# Patient Record
Sex: Female | Born: 1955 | Race: Black or African American | Hispanic: No | Smoking: Former smoker
Health system: Southern US, Community
[De-identification: ages and names within clinical notes are randomized; demographics above are authoritative.]

## PROBLEM LIST (undated history)

## (undated) DIAGNOSIS — K219 Gastro-esophageal reflux disease without esophagitis: Secondary | ICD-10-CM

## (undated) DIAGNOSIS — R519 Headache, unspecified: Secondary | ICD-10-CM

## (undated) DIAGNOSIS — I10 Essential (primary) hypertension: Secondary | ICD-10-CM

## (undated) DIAGNOSIS — M1711 Unilateral primary osteoarthritis, right knee: Secondary | ICD-10-CM

## (undated) DIAGNOSIS — R7303 Prediabetes: Secondary | ICD-10-CM

## (undated) DIAGNOSIS — Z8709 Personal history of other diseases of the respiratory system: Secondary | ICD-10-CM

## (undated) HISTORY — PX: SHOULDER SURGERY: SHX246

## (undated) HISTORY — PX: OTHER SURGICAL HISTORY: SHX169

---

## 2009-03-09 ENCOUNTER — Ambulatory Visit: Payer: Self-pay | Admitting: Family Medicine

## 2009-03-24 ENCOUNTER — Ambulatory Visit: Payer: Self-pay | Admitting: Family Medicine

## 2010-08-22 ENCOUNTER — Ambulatory Visit: Payer: Self-pay | Admitting: Family Medicine

## 2010-11-21 ENCOUNTER — Emergency Department: Payer: Self-pay | Admitting: Unknown Physician Specialty

## 2014-07-06 ENCOUNTER — Emergency Department: Payer: Self-pay | Admitting: Emergency Medicine

## 2014-09-02 ENCOUNTER — Ambulatory Visit: Payer: Self-pay | Admitting: Unknown Physician Specialty

## 2014-10-11 ENCOUNTER — Ambulatory Visit: Payer: Self-pay | Admitting: Anesthesiology

## 2014-10-11 DIAGNOSIS — I10 Essential (primary) hypertension: Secondary | ICD-10-CM

## 2014-10-14 ENCOUNTER — Ambulatory Visit: Payer: Self-pay | Admitting: Surgery

## 2014-12-19 NOTE — Op Note (Signed)
PATIENT NAME:  Brandi Wiggins, KOPPEL MR#:  [id number] DATE OF BIRTH:  [birth year]  DATE OF PROCEDURE:  10/14/2014  PREOPERATIVE DIAGNOSIS: Rotator cuff tear, left shoulder   POSTOPERATIVE DIAGNOSES:  Rotator cuff tear with a SLAP tear and early degenerative joint disease, left shoulder.   PROCEDURES: Arthroscopic SLAP repair, arthroscopic debridement of grade 3 chondromalacial changes of the glenoid, arthroscopic subacromial decompression, mini-open repair of rotator cuff tear, and mini-open biceps tenodesis left shoulder.   SURGEON:  Maryagnes Amos, M.D.   ASSISTANT: Dedra Skeens,  PA-C.  ANESTHESIA: General endotracheal with interscalene block placed preoperatively by the anesthesiologist.   FINDINGS: As noted above. There was a full-thickness tear involving the mid and posterior insertional fibers of the supraspinatus tendon. The remaining portions of the rotator cuff all were in satisfactory condition. The labrum was torn from the 11 o'clock to the 12:30 position with fraying anteriorly and inferiorly as well. There were grade 3 chondromalacial changes involving the lower half of the glenoid.   COMPLICATIONS: None.   ESTIMATED BLOOD LOSS: Minimal.   TOTAL FLUIDS:  800 mL of crystalloid.   TOURNIQUET: None.   DRAINS: None.  CLOSURE: Staples.   BRIEF CLINICAL NOTE: The patient is a 59 year old female with a 6+ month history of left shoulder pain. Her symptoms have persisted despite medications, activity modification, et Karie Soda. Her history and examination are consistent a rotator cuff tear confirmed by MRI scan. She presents at this time for arthroscopy, debridement, decompression, and repair of the rotator cuff tear.   DESCRIPTION OF PROCEDURE: The patient was brought into the Operating Room and lain in the supine position. After adequate IV sedation was achieved, an interscalene block was placed by the anesthesiologist. The patient then underwent general endotracheal intubation and  anesthesia before being repositioned in the beach chair position using the beach chair positioner. The left shoulder and upper extremity were prepped with ChloraPrep solution before being draped sterilely. Preoperative antibiotics were administered. The expected portal sites and incision site were injected with 1% lidocaine with epinephrine before the camera was placed in the posterior portal. The glenohumeral joint was thoroughly inspected with the findings as described above. An anterior portal was created using an outside-in technique. The labrum and rotator cuff tears were carefully probed, confirming the above noted findings. The shaver was used to debride the frayed margins of the rotator cuff, the labrum, and the areas of chondromalacia on the glenoid. The biceps was released from its labral anchor using the ArthroCare wand. The ArthroCare wand also was used to contour the bicipital insertion site on the labrum as well as to anneal the frayed margins of the labrum. A separate superolateral portal site was created using an outside in technique and the torn portion of the superior labrum was secured using a single BioKnotless anchor placed at approximately the 12 o'clock position. This was done after the exposed glenoid rim was roughened with an end-cutting rasp and full radius resector to stimulate good healing response. Subsequent probing of the repair demonstrated excellent stability. The instruments were removed from the joint after suctioning the excess fluid.   The camera was repositioned through the posterior portal into the subacromial space. A separate lateral portal was created and the shaver introduced to perform a subtotal debridement of the bursa. The ArthroCare wand was introduced and used to recess the coracoacromial ligament from its attachment along the anterior and lateral margins of the acromion as well as to remove the periosteal tissues off the undersurface of  the anterior third of the  acromion. It also was used to obtain hemostasis. The 4 mm acromionizer bur was inserted and used to complete the decompression by removing the undersurface, anterior third of the acromion. The shaver was reintroduced to remove any residual bony debris before the ArthroCare wand was reintroduced to obtain hemostasis. These instruments then were removed from the subacromial space after suctioning the excess fluid.   An approximately 4 to 5 cm incision was made over the anterolateral aspect of the shoulder, incorporating the anterolateral portal site. The incision was carried down through the subcutaneous tissues to expose the deltoid fascia. The raphe between the anterior and middle thirds was identified and this plane developed to provide access into the subacromial space. Additional bursal tissues were debrided to improve visualization. The frayed margins of the rotator cuff were trimmed using a #15 blade. The exposed greater tuberosity was roughened with a rongeur before the tear was reapproximated using two 2.9 mm Biomet JuggerKnot anchors. The tear had a short longitudinal component posteriorly which was repaired in a side-to-side fashion using a #0 Ethibond interrupted suture. Both sets of sutures from the more anterior anchor were placed through the lateral flap of the torn rotator cuff whereas the more anterior suture of the more posterior anchor was placed through the lateral margin of the torn portion of the rotator cuff whereas the posterior sutures were placed through the posterior portion of the torn rotator cuff. The sutures were tied securely. Several of these sutures were then brought out laterally through bone tunnels and tied over bony bridges to effect a two layer closure. An apparent watertight closure was obtained.   The bicipital groove was identified by palpation and opened to deliver the biceps tendon into the wound. The floor of the bicipital groove was roughened with a curette before a  third 2.9 mm Biomet JuggerKnot anchor was inserted. Both sets of sutures were passed through the tendon and tied securely to effect the tenodesis. Two additional #0 Ethibond sutures were placed to close the bicipital sheath, incorporating the biceps tendon to further reinforce the tenodesis.   The wound was copiously irrigated with sterile saline solution before the deltoid raphe was reapproximated using 2-0 Vicryl interrupted sutures. The subcutaneous tissues were closed in two layers using 2-0 Vicryl interrupted sutures before the skin was closed using staples. The portal sites also were closed using staples. A sterile bulky dressing was applied to the shoulder before the arm was placed into a shoulder immobilizer. The patient was then awakened, extubated, and returned to the recovery room in satisfactory condition after tolerating the procedure well.     ____________________________ J. Derald MacleodJeffrey Janyra Barillas, MD jjp:at D: 10/14/2014 10:11:22 ET T: 10/14/2014 18:08:54 ET JOB#: [id number]  cc: Maryagnes AmosJ. Jeffrey Manal Kreutzer, MD, <Dictator> Maryagnes AmosJ. JEFFREY Samyia Motter MD ELECTRONICALLY SIGNED 10/19/2014 11:39

## 2017-03-07 ENCOUNTER — Encounter: Payer: Self-pay | Admitting: *Deleted

## 2017-03-07 ENCOUNTER — Ambulatory Visit
Admission: EM | Admit: 2017-03-07 | Discharge: 2017-03-07 | Disposition: A | Discharge status: Home / Self Care | Payer: BLUE CROSS/BLUE SHIELD | Attending: Family Medicine | Admitting: Family Medicine

## 2017-03-07 DIAGNOSIS — N390 Urinary tract infection, site not specified: Secondary | ICD-10-CM

## 2017-03-07 DIAGNOSIS — B373 Candidiasis of vulva and vagina: Secondary | ICD-10-CM

## 2017-03-07 DIAGNOSIS — B3731 Acute candidiasis of vulva and vagina: Secondary | ICD-10-CM

## 2017-03-07 LAB — URINALYSIS, COMPLETE (UACMP) WITH MICROSCOPIC
Bacteria, UA: NONE SEEN
Bilirubin Urine: NEGATIVE
Glucose, UA: NEGATIVE mg/dL
KETONES UR: NEGATIVE mg/dL
NITRITE: NEGATIVE
PROTEIN: NEGATIVE mg/dL
Specific Gravity, Urine: 1.025 (ref 1.005–1.030)
pH: 7 (ref 5.0–8.0)

## 2017-03-07 MED ORDER — FLUCONAZOLE 150 MG PO TABS: ORAL_TABLET | ORAL | 0 refills | Status: DC

## 2017-03-07 MED ORDER — CEPHALEXIN 500 MG PO CAPS: 500.0000 mg | ORAL_CAPSULE | Freq: Two times a day (BID) | ORAL | 0 refills | Status: DC

## 2017-03-07 NOTE — ED Provider Notes (Addendum)
CSN: [id number]     Arrival date & time 03/07/17  1037 History   First MD Initiated Contact with Patient 03/07/17 1121     Chief Complaint  Patient presents with  . Urinary Frequency   (Consider location/radiation/quality/duration/timing/severity/associated sxs/prior Treatment) HPI  This a 61 year old female who presents with urinary frequency urgency and recent in satiety with a mild amount of  dysuria she's been experiencing for about 2 weeks. She started having a vaginal discharge of white clumpy material up. She attempted to use Monistat one dose last night but seem to exacerbate her symptoms. Denies any fever or chills and has only had mild back ache in her lower back. She has had urinary tract infections before and she states that this appears very similar.        History reviewed. No pertinent past medical history. Past Surgical History:  Procedure Laterality Date  . SHOULDER SURGERY     Family History  Problem Relation Age of Onset  . Cancer Mother   . CAD Father    Social History  Substance Use Topics  . Smoking status: Current Every Day Smoker    Packs/day: 0.50    Types: Cigarettes  . Smokeless tobacco: Never Used  . Alcohol use Yes   OB History    No data available     Review of Systems  Constitutional: Negative for activity change, appetite change, chills, fatigue and fever.  Genitourinary: Positive for dysuria, frequency, urgency and vaginal discharge.  All other systems reviewed and are negative.   Allergies  Patient has no known allergies.  Home Medications   Prior to Admission medications   Medication Sig Start Date End Date Taking? Authorizing Provider  cephALEXin (KEFLEX) 500 MG capsule Take 1 capsule (500 mg total) by mouth 2 (two) times daily. 03/07/17   Lutricia Feil, PA-C  fluconazole (DIFLUCAN) 150 MG tablet Take one tab for symptoms of yeast infection. Repeat x 1 in 72 hours. 03/07/17   Lutricia Feil, PA-C   Meds Ordered and  Administered this Visit  Medications - No data to display  BP (!) 158/88 (BP Location: Left Arm)   Pulse 88   Temp 98.1 F (36.7 C) (Oral)   Resp 16   Ht 5\' 8"  (1.727 m)   Wt 238 lb (108 kg)   SpO2 100%   BMI 36.19 kg/m  No data found.   Physical Exam  Constitutional: She is oriented to person, place, and time. She appears well-developed and well-nourished. No distress.  HENT:  Head: Normocephalic.  Eyes: Pupils are equal, round, and reactive to light.  Neck: Normal range of motion.  Pulmonary/Chest: Effort normal and breath sounds normal.  Abdominal: Soft. Bowel sounds are normal. She exhibits no distension and no mass. There is no tenderness. There is no rebound and no guarding.  Musculoskeletal: Normal range of motion.  Neurological: She is alert and oriented to person, place, and time.  Skin: Skin is warm and dry. She is not diaphoretic.  Psychiatric: She has a normal mood and affect. Her behavior is normal. Judgment and thought content normal.  Nursing note and vitals reviewed.   Urgent Care Course     Procedures (including critical care time)  Labs Review Labs Reviewed  URINALYSIS, COMPLETE (UACMP) WITH MICROSCOPIC - Abnormal; Notable for the following:       Result Value   Hgb urine dipstick SMALL (*)    Leukocytes, UA MODERATE (*)    Squamous Epithelial / LPF 6-30 (*)  All other components within normal limits  URINE CULTURE    Imaging Review No results found.   Visual Acuity Review  Right Eye Distance:   Left Eye Distance:   Bilateral Distance:    Right Eye Near:   Left Eye Near:    Bilateral Near:         MDM   1. Urinary tract infection without hematuria, site unspecified   2. Candida vaginitis    Discharge Medication List as of 03/07/2017 11:34 AM    START taking these medications   Details  cephALEXin (KEFLEX) 500 MG capsule Take 1 capsule (500 mg total) by mouth 2 (two) times daily., Starting Thu 03/07/2017, Normal     fluconazole (DIFLUCAN) 150 MG tablet Take one tab for symptoms of yeast infection. Repeat x 1 in 72 hours., Normal      Plan: 1. Test/x-ray results and diagnosis reviewed with patient 2. rx as per orders; risks, benefits, potential side effects reviewed with patient 3. Recommend supportive treatment with Drinking plenty of fluids. Will treat for both possible UTI and yeast infection. I have told her there may be other issues but we will start with this conservative approach if she is still having symptoms may need to have a pelvic exam performed by primary care GYN or she may return to our clinic. Urine culture will be available in 48 hours. This will be likely mixed flora due to the number of the squamous epithelial cells in her urinalysis. She's not improving I recommend she follow-up with her primary care physician. 4. F/u prn if symptoms worsen or don't improve     Lutricia FeilRoemer, Starletta Houchin P, PA-C 03/07/17 1141    Lutricia Feiloemer, Daniah Zaldivar P, PA-C 03/07/17 1142

## 2017-03-07 NOTE — ED Triage Notes (Signed)
Patient started having symptom of urinary frequency 2 weeks ago. Patient tried treating for possible yeast infection yesterday which increased symptoms today.

## 2017-10-16 ENCOUNTER — Ambulatory Visit
Admission: EM | Admit: 2017-10-16 | Discharge: 2017-10-16 | Disposition: A | Discharge status: Home / Self Care | Payer: BLUE CROSS/BLUE SHIELD | Attending: Family Medicine | Admitting: Family Medicine

## 2017-10-16 ENCOUNTER — Other Ambulatory Visit: Payer: Self-pay

## 2017-10-16 ENCOUNTER — Encounter: Payer: Self-pay | Admitting: Gynecology

## 2017-10-16 DIAGNOSIS — R05 Cough: Secondary | ICD-10-CM

## 2017-10-16 DIAGNOSIS — J069 Acute upper respiratory infection, unspecified: Secondary | ICD-10-CM

## 2017-10-16 DIAGNOSIS — B9789 Other viral agents as the cause of diseases classified elsewhere: Secondary | ICD-10-CM

## 2017-10-16 NOTE — ED Provider Notes (Signed)
[facility]    CSN: [id number] Arrival date & time: 10/16/17  1141     History   Chief Complaint Chief Complaint  Patient presents with  . Headache    HPI Brandi Wiggins is a 62 y.o. female.   The history is provided by the patient.  Headache  Associated symptoms: congestion, cough, fatigue and URI   URI  Presenting symptoms: congestion, cough, fatigue and rhinorrhea   Severity:  Moderate Onset quality:  Sudden Duration:  2 days Timing:  Constant Progression:  Worsening Chronicity:  New Relieved by:  None tried Ineffective treatments:  None tried Associated symptoms: headaches   Associated symptoms: no sinus pain and no wheezing   Risk factors: sick contacts   Risk factors: not elderly, no chronic cardiac disease, no chronic kidney disease, no chronic respiratory disease, no diabetes mellitus, no immunosuppression, no recent illness and no recent travel     History reviewed. No pertinent past medical history.  There are no active problems to display for this patient.   Past Surgical History:  Procedure Laterality Date  . SHOULDER SURGERY      OB History    No data available       Home Medications    Prior to Admission medications   Medication Sig Start Date End Date Taking? Authorizing Provider  cephALEXin (KEFLEX) 500 MG capsule Take 1 capsule (500 mg total) by mouth 2 (two) times daily. 03/07/17   Lutricia Feiloemer, William P, PA-C  fluconazole (DIFLUCAN) 150 MG tablet Take one tab for symptoms of yeast infection. Repeat x 1 in 72 hours. 03/07/17   Lutricia Feiloemer, William P, PA-C    Family History Family History  Problem Relation Age of Onset  . Cancer Mother   . CAD Father     Social History Social History   Tobacco Use  . Smoking status: Current Every Day Smoker    Packs/day: 0.50    Types: Cigarettes  . Smokeless tobacco: Never Used  Substance Use Topics  . Alcohol use: Yes  . Drug use: No     Allergies   Patient has no known  allergies.   Review of Systems Review of Systems  Constitutional: Positive for fatigue.  HENT: Positive for congestion and rhinorrhea. Negative for sinus pain.   Respiratory: Positive for cough. Negative for wheezing.   Neurological: Positive for headaches.     Physical Exam Triage Vital Signs ED Triage Vitals  Enc Vitals Group     BP 10/16/17 1205 (!) 163/104     Pulse Rate 10/16/17 1205 79     Resp 10/16/17 1205 16     Temp 10/16/17 1205 98.4 F (36.9 C)     Temp Source 10/16/17 1205 Oral     SpO2 10/16/17 1205 99 %     Weight 10/16/17 1203 250 lb (113.4 kg)     Height 10/16/17 1203 5\' 7"  (1.702 m)     Head Circumference --      Peak Flow --      Pain Score 10/16/17 1203 7     Pain Loc --      Pain Edu? --      Excl. in GC? --    No data found.  Updated Vital Signs BP (!) 163/104 (BP Location: Left Arm)   Pulse 79   Temp 98.4 F (36.9 C) (Oral)   Resp 16   Ht 5\' 7"  (1.702 m)   Wt 250 lb (113.4 kg)   SpO2 99%  BMI 39.16 kg/m   Visual Acuity Right Eye Distance:   Left Eye Distance:   Bilateral Distance:    Right Eye Near:   Left Eye Near:    Bilateral Near:     Physical Exam  Constitutional: She appears well-developed and well-nourished. No distress.  HENT:  Head: Normocephalic and atraumatic.  Right Ear: Tympanic membrane, external ear and ear canal normal.  Left Ear: Tympanic membrane, external ear and ear canal normal.  Nose: Mucosal edema and rhinorrhea present. No nose lacerations, sinus tenderness, nasal deformity, septal deviation or nasal septal hematoma. No epistaxis.  No foreign bodies. Right sinus exhibits no maxillary sinus tenderness and no frontal sinus tenderness. Left sinus exhibits no maxillary sinus tenderness and no frontal sinus tenderness.  Mouth/Throat: Uvula is midline, oropharynx is clear and moist and mucous membranes are normal. No oropharyngeal exudate.  Eyes: Conjunctivae and EOM are normal. Pupils are equal, round, and  reactive to light. Right eye exhibits no discharge. Left eye exhibits no discharge. No scleral icterus.  Neck: Normal range of motion. Neck supple. No thyromegaly present.  Cardiovascular: Normal rate, regular rhythm and normal heart sounds.  Pulmonary/Chest: Effort normal and breath sounds normal. No stridor. No respiratory distress. She has no wheezes. She has no rales.  Lymphadenopathy:    She has no cervical adenopathy.  Skin: She is not diaphoretic.  Nursing note and vitals reviewed.    UC Treatments / Results  Labs (all labs ordered are listed, but only abnormal results are displayed) Labs Reviewed - No data to display  EKG  EKG Interpretation None       Radiology No results found.  Procedures Procedures (including critical care time)  Medications Ordered in UC Medications - No data to display   Initial Impression / Assessment and Plan / UC Course  I have reviewed the triage vital signs and the nursing notes.  Pertinent labs & imaging results that were available during my care of the patient were reviewed by me and considered in my medical decision making (see chart for details).       Final Clinical Impressions(s) / UC Diagnoses   Final diagnoses:  Viral URI with cough    ED Discharge Orders    None     1. diagnosis reviewed with patient 2. rx as per orders above; reviewed possible side effects, interactions, risks and benefits  3. Recommend supportive treatment with rest, fluids, otc meds 4. Follow-up prn if symptoms worsen or don't improve  Controlled Substance Prescriptions [place] Controlled Substance Registry consulted? Not Applicable   Payton Mccallum, MD 10/16/17 1259

## 2017-10-16 NOTE — ED Triage Notes (Signed)
Patient c/o headache/ facial congestion and cough.

## 2018-11-18 ENCOUNTER — Ambulatory Visit
Admission: EM | Admit: 2018-11-18 | Discharge: 2018-11-18 | Disposition: A | Discharge status: Home / Self Care | Payer: BLUE CROSS/BLUE SHIELD | Attending: Urgent Care | Admitting: Urgent Care

## 2018-11-18 ENCOUNTER — Other Ambulatory Visit: Payer: Self-pay

## 2018-11-18 ENCOUNTER — Encounter: Payer: Self-pay | Admitting: Emergency Medicine

## 2018-11-18 DIAGNOSIS — R05 Cough: Secondary | ICD-10-CM

## 2018-11-18 DIAGNOSIS — R059 Cough, unspecified: Secondary | ICD-10-CM

## 2018-11-18 DIAGNOSIS — J0100 Acute maxillary sinusitis, unspecified: Secondary | ICD-10-CM

## 2018-11-18 DIAGNOSIS — F17210 Nicotine dependence, cigarettes, uncomplicated: Secondary | ICD-10-CM | POA: Diagnosis not present

## 2018-11-18 HISTORY — DX: Essential (primary) hypertension: I10

## 2018-11-18 MED ORDER — AMOXICILLIN 875 MG PO TABS: 875.0000 mg | ORAL_TABLET | Freq: Two times a day (BID) | ORAL | 0 refills | Status: DC

## 2018-11-18 NOTE — ED Provider Notes (Signed)
[facility] [address], [address] [place], [place] [address] [phone]   Time seen: Approximately 2:05 PM  I have reviewed the triage vital signs and the nursing notes.  Chief Complaint Cough and Sinus Problem   HPI Brandi Wiggins is a 63 y.o. female patient presents with a 2-3 weeks history of paranasal sinus tenderness. Over the course of the last 2-3 days, patient notes that the congestion has moved into her chest. She has an intermittently productive cough (yellow sputum). No shortness of breath at rest, however with long distance ambulation she notes that her breathing is affected. Despite acute respiratory complaints, patient continues to smoke daily.   Past Medical History:  Diagnosis Date  . Hypertension     There are no active problems to display for this patient.   Past Surgical History:  Procedure Laterality Date  . SHOULDER SURGERY      No current facility-administered medications for this encounter.   Current Outpatient Medications:  .  amoxicillin (AMOXIL) 875 MG tablet, Take 1 tablet (875 mg total) by mouth 2 (two) times daily., Disp: 14 tablet, Rfl: 0  Allergies Patient has no known allergies.  Family history Family History  Problem Relation Age of Onset  . Cancer Mother   . CAD Father     Social history Social History   Tobacco Use  . Smoking status: Current Every Day Smoker    Packs/day: 0.50    Types: Cigarettes  . Smokeless tobacco: Never Used  Substance Use Topics  . Alcohol use: Yes  . Drug use: No    Review of Systems Review of Systems  Constitutional: Negative for diaphoresis, fever, malaise/fatigue and weight loss.  Respiratory: Positive for cough, sputum production (yellow/white) and shortness of breath (exertional). Negative for hemoptysis.   Cardiovascular: Negative for chest pain, palpitations and orthopnea.  Gastrointestinal: Negative for abdominal pain, diarrhea, nausea and  vomiting.  Musculoskeletal: Negative.   Psychiatric/Behavioral: Negative.   All other systems reviewed and are negative.  ____________________________________________   PHYSICAL EXAM:  VITAL SIGNS: ED Triage Vitals  Enc Vitals Group     BP      Pulse      Resp      Temp      Temp src      SpO2      Weight      Height      Head Circumference      Peak Flow      Pain Score      Pain Loc      Pain Edu?      Excl. in GC?     Physical Exam  Constitutional: She is oriented to person, place, and time and well-developed, well-nourished, and in no distress.  Non-toxic appearance. She has a sickly appearance.  HENT:  Head: Normocephalic and atraumatic.  Nose: Rhinorrhea present. Right sinus exhibits maxillary sinus tenderness. Left sinus exhibits maxillary sinus tenderness.  Mouth/Throat: Oropharynx is clear and moist. Mucous membranes are dry.  Eyes: Pupils are equal, round, and reactive to light. EOM are normal. No scleral icterus.  Neck: Normal range of motion. Neck supple. No tracheal deviation present.  Cardiovascular: Normal rate, regular rhythm, normal heart sounds and intact distal pulses. Exam reveals no gallop and no friction rub.  No murmur heard. Pulmonary/Chest: Effort normal. She has rales (upper airways; clears completely with cough).  Musculoskeletal: Normal range of motion.  Lymphadenopathy:    She has no cervical adenopathy.  Neurological: She is alert and oriented to person, place, and time.  Skin: Skin is warm and dry. No rash noted. No erythema.  Psychiatric: Mood, affect and judgment normal.  Nursing note and vitals reviewed.  ___________________________________________  INITIAL IMPRESSION / ASSESSMENT AND PLAN / URGENT CARE COURSE  Pertinent labs & imaging results that were available during my care of the patient were reviewed by me and considered in my medical decision making (see chart for details).   Brandi Wiggins is a 63 y.o. female presents  with a 2-3 week history of worsening paranasal sinus tenderness. She denies associated fevers. Patient noted that over the last 2-3 days, the congestion has moved into her chest. Patient is a daily smoker.   Based on her exam, and recent diagnosis of viral URI, I feel that it is completely reasonable to cover her with antibiotics at this point. Encouraged smoking cessation. Advised patient to take antibiotic course as prescribed. She was also asked to consider purchasing a cool mist vaporizer and/or to utilize saline nasal spray to help with her current symptomology.   Discussed follow up with primary care physician this week for re-evaluation. Discussed follow up and return precautions for any new or worsening symptoms. Patient verbalized understanding and consent with the discharge plan as reviewed. All questions were answered prior to patient discharge.   ____________________________________________  FINAL CLINICAL IMPRESSION(S) / URGENT CARE DIAGNOSES  Final diagnoses:  Acute non-recurrent maxillary sinusitis  Cough    ED Discharge Orders         Ordered    amoxicillin (AMOXIL) 875 MG tablet  2 times daily     11/18/18 1420          Note: This dictation was prepared with Dragon dictation along with smaller phrase technology. Any transcriptional errors that result from this process are unintentional.     Verlee Monte, NP 11/18/18 1431

## 2018-11-18 NOTE — Discharge Instructions (Signed)
Symptoms are consistent with a sinus infection. Stay well hydrated. Take the antibiotics as prescribed. Consider a cool mist vaporizer and/or saline nasal spray to help with the dryness.   Follow up with your PCP if not feeling better.

## 2018-11-18 NOTE — ED Triage Notes (Signed)
Pt c/o cough, sinus congestion, chest heaviness, and mild shortness of breath. Sinus congestion started about 3 weeks ago and chest heaviness started about 3-4 days ago. Denies fever, chills or in contact with COVID-19.

## 2019-03-26 ENCOUNTER — Other Ambulatory Visit: Payer: Self-pay | Admitting: Family Medicine

## 2019-03-26 DIAGNOSIS — Z1231 Encounter for screening mammogram for malignant neoplasm of breast: Secondary | ICD-10-CM

## 2019-05-14 ENCOUNTER — Other Ambulatory Visit: Payer: Self-pay

## 2019-05-14 ENCOUNTER — Ambulatory Visit: Payer: Self-pay

## 2019-05-14 VITALS — BP 130/76 | HR 102 | Resp 18 | Ht 67.0 in | Wt 255.8 lb

## 2019-05-14 DIAGNOSIS — Z008 Encounter for other general examination: Secondary | ICD-10-CM

## 2019-05-14 LAB — POCT LIPID PANEL
HDL: 45
LDL: 87
Non-HDL: 125
POC Glucose: 120 mg/dl — AB (ref 70–99)
TC/HDL: 3.8
TC: 171
TRG: 189

## 2019-05-14 NOTE — Progress Notes (Signed)
     Patient ID: Brandi Wiggins, female    DOB: 21-May-1956, 63 y.o.   MRN: [id number]    Thank you!!  [facility] Nurse Specialist [place]: [phone]  Cell:  [phone] Website: [email or web address]

## 2020-03-15 ENCOUNTER — Other Ambulatory Visit: Payer: Self-pay

## 2020-03-15 ENCOUNTER — Ambulatory Visit
Admission: EM | Admit: 2020-03-15 | Discharge: 2020-03-15 | Disposition: A | Discharge status: Home / Self Care | Payer: BC Managed Care – PPO | Attending: Family Medicine | Admitting: Family Medicine

## 2020-03-15 ENCOUNTER — Ambulatory Visit (INDEPENDENT_AMBULATORY_CARE_PROVIDER_SITE_OTHER)
Admission: RE | Admit: 2020-03-15 | Discharge: 2020-03-15 | Disposition: A | Discharge status: Home / Self Care | Payer: BC Managed Care – PPO | Source: Ambulatory Visit | Attending: Family Medicine | Admitting: Family Medicine

## 2020-03-15 ENCOUNTER — Encounter: Payer: Self-pay | Admitting: Emergency Medicine

## 2020-03-15 DIAGNOSIS — R519 Headache, unspecified: Secondary | ICD-10-CM

## 2020-03-15 MED ORDER — SUMATRIPTAN SUCCINATE 50 MG PO TABS: 50.0000 mg | ORAL_TABLET | Freq: Once | ORAL | 0 refills | Status: DC

## 2020-03-15 NOTE — ED Triage Notes (Signed)
Patient c/o headache x 1 week. Patient reports pressure behind her right eye.

## 2020-03-15 NOTE — ED Provider Notes (Signed)
[facility]    CSN: [id number] Arrival date & time: 03/15/20  1658      History   Chief Complaint Chief Complaint  Patient presents with  . Headache   HPI  64 year old female presents with headache.  Patient reports that over the past week she has had right temporal headache.  It has woken her out of sleep.  Feels like pressure behind her right eye.  Has been severe at times.  She has never had a headache like this before.  She has had some nausea.  No reports of vision changes or photophobia.  She does note that she has underlying sinus issues.  No fever.  No weakness or speech difficulty.  She has taken some ibuprofen without relief.  No other reported symptoms.  No other complaints.  Past Medical History:  Diagnosis Date  . Hypertension    Past Surgical History:  Procedure Laterality Date  . SHOULDER SURGERY      OB History   No obstetric history on file.      Home Medications    Prior to Admission medications   Medication Sig Start Date End Date Taking? Authorizing Provider  lisinopril-hydrochlorothiazide (ZESTORETIC) 20-12.5 MG tablet Take 1 tablet by mouth daily.   Yes [provider]  losartan-hydrochlorothiazide (HYZAAR) 100-25 MG tablet Take 1 tablet by mouth daily. 12/01/19  Yes [provider]  amoxicillin (AMOXIL) 875 MG tablet Take 1 tablet (875 mg total) by mouth 2 (two) times daily. 11/18/18   Karen Kitchens, NP  SUMAtriptan (IMITREX) 50 MG tablet Take 1 tablet (50 mg total) by mouth once for 1 dose. May repeat in 2 hours if headache persists or recurs. 03/15/20 03/15/20  Coral Spikes, DO    Family History Family History  Problem Relation Age of Onset  . Cancer Mother   . CAD Father     Social History Social History   Tobacco Use  . Smoking status: Current Every Day Smoker    Packs/day: 0.50    Types: Cigarettes  . Smokeless tobacco: Never Used  Vaping Use  . Vaping Use: Never used  Substance Use Topics  .  Alcohol use: Yes  . Drug use: No     Allergies   Patient has no known allergies.   Review of Systems Review of Systems  Gastrointestinal: Positive for nausea.  Neurological: Positive for headaches.   Physical Exam Triage Vital Signs ED Triage Vitals  Enc Vitals Group     BP 03/15/20 1854 (!) 130/85     Pulse Rate 03/15/20 1854 76     Resp 03/15/20 1854 18     Temp 03/15/20 1854 98.2 F (36.8 C)     Temp Source 03/15/20 1854 Oral     SpO2 03/15/20 1854 98 %     Weight 03/15/20 1852 (!) 255 lb 11.7 oz (116 kg)     Height 03/15/20 1852 5' 7"  (1.702 m)     Head Circumference --      Peak Flow --      Pain Score 03/15/20 1851 10     Pain Loc --      Pain Edu? --      Excl. in [place]? --    Updated Vital Signs BP (!) 130/85 (BP Location: Right Arm)   Pulse 76   Temp 98.2 F (36.8 C) (Oral)   Resp 18   Ht 5' 7"  (1.702 m)   Wt (!) 116 kg   SpO2 98%  BMI 40.05 kg/m   Visual Acuity Right Eye Distance:   Left Eye Distance:   Bilateral Distance:    Right Eye Near:   Left Eye Near:    Bilateral Near:     Physical Exam Constitutional:      General: She is not in acute distress.    Appearance: Normal appearance. She is not ill-appearing.  HENT:     Head: Normocephalic and atraumatic.  Eyes:     Extraocular Movements: Extraocular movements intact.     Conjunctiva/sclera: Conjunctivae normal.     Pupils: Pupils are equal, round, and reactive to light.  Cardiovascular:     Rate and Rhythm: Normal rate and regular rhythm.  Pulmonary:     Effort: Pulmonary effort is normal.     Breath sounds: Normal breath sounds. No wheezing, rhonchi or rales.  Neurological:     General: No focal deficit present.     Mental Status: She is alert and oriented to person, place, and time.     Cranial Nerves: No cranial nerve deficit.     Motor: No weakness.  Psychiatric:        Mood and Affect: Mood normal.        Behavior: Behavior normal.    UC Treatments / Results  Labs (all  labs ordered are listed, but only abnormal results are displayed) Labs Reviewed - No data to display  EKG   Radiology CT Head Wo Contrast  Result Date: 03/15/2020 CLINICAL DATA:  Headache, new or worsening. Additional history provided by scanning technologist: Patient reports severe right temporal headache for 1.5 weeks. EXAM: CT HEAD WITHOUT CONTRAST TECHNIQUE: Contiguous axial images were obtained from the base of the skull through the vertex without intravenous contrast. COMPARISON:  No pertinent prior exams are available for comparison. FINDINGS: Brain: Cerebral volume is normal. There is no acute intracranial hemorrhage. No demarcated cortical infarct. No extra-axial fluid collection. No evidence of intracranial mass. No midline shift. Vascular: No hyperdense vessel.  Atherosclerotic calcifications Skull: No calvarial fracture. There are multiple nonspecific small well-circumscribed lucent foci within the calvarium bilaterally. Sinuses/Orbits: Visualized orbits show no acute finding. Mild ethmoid sinus mucosal thickening at the imaged levels. Small right mastoid effusion. IMPRESSION: Unremarkable non-contrast CT appearance of the brain for age. No evidence of acute intracranial abnormality. Multiple nonspecific small lucent foci within the calvarium bilaterally. These could certainly be benign. However, given lack of prior examinations for comparison consider correlation with relevant laboratory values to exclude myeloma. Mild ethmoid sinus mucosal thickening. Small right mastoid effusion. Electronically Signed   By: Kellie Simmering DO   On: 03/15/2020 19:30    Procedures Procedures (including critical care time)  Medications Ordered in UC Medications - No data to display  Initial Impression / Assessment and Plan / UC Course  I have reviewed the triage vital signs and the nursing notes.  Pertinent labs & imaging results that were available during my care of the patient were reviewed by me and  considered in my medical decision making (see chart for details).    65 year old female presents with new onset headache.  Etiology remains unclear at this time.  Given age and presentation, CT of the head was done.  Our system was down at the time that it was performed.  Initial reading was done by me.  There was no visible acute intracranial abnormalities and patient was discharged home on Imitrex.  Result later returned after radiology read and revealed knowing acute abnormality of the brain.  It did reveal some nonspecific findings of the calvarium which is likely benign but could be from multiple myeloma.  Patient will need follow-up with primary care physician and/or hematology oncology.  Final Clinical Impressions(s) / UC Diagnoses   Final diagnoses:  New onset of headaches after age 68     Discharge Instructions     Medication as directed.  CT looks okay.  Take care  Dr. Lacinda Axon    ED Prescriptions    Medication Sig Dispense Auth. Provider   SUMAtriptan (IMITREX) 50 MG tablet Take 1 tablet (50 mg total) by mouth once for 1 dose. May repeat in 2 hours if headache persists or recurs. 10 tablet Coral Spikes, DO     PDMP not reviewed this encounter.   Coral Spikes, [place] 03/15/20 2121

## 2020-03-15 NOTE — Discharge Instructions (Addendum)
Medication as directed.  CT looks okay.  Take care  Dr. Adriana Simas

## 2020-04-01 DIAGNOSIS — M899 Disorder of bone, unspecified: Secondary | ICD-10-CM | POA: Insufficient documentation

## 2020-04-01 NOTE — Progress Notes (Signed)
[facility]  [phone] [phone]  ID: Kierstan Auer OB: 29-Jan-1956  MR#: [id number]  NAT#:[id number]  Patient Care Team: Marguerita Merles, MD as PCP - General (Family Medicine)  CHIEF COMPLAINT: Bone lesion.  INTERVAL HISTORY: Patient is a 64 year old female who underwent CT scanning of the head for evaluation of persistent headaches.  Although no distinct etiology was determined, she was noted to have several abnormalities in her calvarium as referred for concern of myeloma.  She continues to have headaches, but otherwise feels well.  She has no other neurologic complaints.  She has good appetite and denies weight loss.  She has no chest pain, shortness of breath, cough, or hemoptysis.  She denies any nausea, vomiting, constipation, or diarrhea.  She has no urinary complaints.  Patient otherwise feels well and offers no further specific complaints today.  REVIEW OF SYSTEMS:   Review of Systems  Constitutional: Negative.  Negative for fever, malaise/fatigue and weight loss.  Respiratory: Negative.  Negative for cough, hemoptysis and shortness of breath.   Cardiovascular: Negative.  Negative for chest pain and leg swelling.  Gastrointestinal: Negative.  Negative for abdominal pain.  Genitourinary: Negative.  Negative for dysuria.  Musculoskeletal: Negative.  Negative for back pain.  Skin: Negative.  Negative for rash.  Neurological: Positive for headaches. Negative for dizziness, focal weakness and weakness.  Psychiatric/Behavioral: Negative.  The patient is not nervous/anxious.     As per HPI. Otherwise, a complete review of systems is negative.  PAST MEDICAL HISTORY: Past Medical History:  Diagnosis Date  . Hypertension     PAST SURGICAL HISTORY: Past Surgical History:  Procedure Laterality Date  . SHOULDER SURGERY      FAMILY HISTORY: Family History  Problem Relation Age of Onset  . Cancer Mother   . CAD Father     ADVANCED  DIRECTIVES (Y/N):  N  HEALTH MAINTENANCE: Social History   Tobacco Use  . Smoking status: Current Every Day Smoker    Packs/day: 0.50    Types: Cigarettes  . Smokeless tobacco: Never Used  Vaping Use  . Vaping Use: Never used  Substance Use Topics  . Alcohol use: Yes  . Drug use: No     Colonoscopy:  PAP:  Bone density:  Lipid panel:  No Known Allergies  Current Outpatient Medications  Medication Sig Dispense Refill  . ergotamine-caffeine (CAFERGOT) 1-100 MG tablet Take 1 tablet by mouth as needed.    Marland Kitchen ibuprofen (ADVIL) 800 MG tablet Take 800 mg by mouth as needed.    Marland Kitchen lisinopril-hydrochlorothiazide (ZESTORETIC) 20-12.5 MG tablet Take 1 tablet by mouth daily.    Marland Kitchen losartan-hydrochlorothiazide (HYZAAR) 100-25 MG tablet Take 1 tablet by mouth daily.     No current facility-administered medications for this visit.    OBJECTIVE: Vitals:   04/04/20 1408  BP: (!) 138/97  Pulse: 81  Resp: 20  Temp: (!) 97.2 F (36.2 C)  SpO2: 97%     Body mass index is 40.3 kg/m.    ECOG FS:0 - Asymptomatic  General: Well-developed, well-nourished, no acute distress. Eyes: Pink conjunctiva, anicteric sclera. HEENT: Normocephalic, moist mucous membranes. Lungs: No audible wheezing or coughing. Heart: Regular rate and rhythm. Abdomen: Soft, nontender, no obvious distention. Musculoskeletal: No edema, cyanosis, or clubbing. Neuro: Alert, answering all questions appropriately. Cranial nerves grossly intact. Skin: No rashes or petechiae noted. Psych: Normal affect. Lymphatics: No cervical, calvicular, axillary or inguinal LAD.   LAB RESULTS:  Lab Results  Component Value Date  NA 140 04/04/2020   K 3.6 04/04/2020   CL 103 04/04/2020   CO2 28 04/04/2020   GLUCOSE 94 04/04/2020   BUN 22 04/04/2020   CREATININE 0.70 04/04/2020   CALCIUM 9.8 04/04/2020   PROT 7.2 04/04/2020   ALBUMIN 3.7 04/04/2020   AST 34 04/04/2020   ALT 54 (H) 04/04/2020   ALKPHOS 72 04/04/2020    BILITOT 0.5 04/04/2020   GFRNONAA >60 04/04/2020   GFRAA >60 04/04/2020    Lab Results  Component Value Date   WBC 7.9 04/04/2020   NEUTROABS 3.2 04/04/2020   HGB 13.2 04/04/2020   HCT 38.7 04/04/2020   MCV 88.8 04/04/2020   PLT 402 (H) 04/04/2020     STUDIES: CT Head Wo Contrast  Result Date: 03/15/2020 CLINICAL DATA:  Headache, new or worsening. Additional history provided by scanning technologist: Patient reports severe right temporal headache for 1.5 weeks. EXAM: CT HEAD WITHOUT CONTRAST TECHNIQUE: Contiguous axial images were obtained from the base of the skull through the vertex without intravenous contrast. COMPARISON:  No pertinent prior exams are available for comparison. FINDINGS: Brain: Cerebral volume is normal. There is no acute intracranial hemorrhage. No demarcated cortical infarct. No extra-axial fluid collection. No evidence of intracranial mass. No midline shift. Vascular: No hyperdense vessel.  Atherosclerotic calcifications Skull: No calvarial fracture. There are multiple nonspecific small well-circumscribed lucent foci within the calvarium bilaterally. Sinuses/Orbits: Visualized orbits show no acute finding. Mild ethmoid sinus mucosal thickening at the imaged levels. Small right mastoid effusion. IMPRESSION: Unremarkable non-contrast CT appearance of the brain for age. No evidence of acute intracranial abnormality. Multiple nonspecific small lucent foci within the calvarium bilaterally. These could certainly be benign. However, given lack of prior examinations for comparison consider correlation with relevant laboratory values to exclude myeloma. Mild ethmoid sinus mucosal thickening. Small right mastoid effusion. Electronically Signed   By: Kellie Simmering DO   On: 03/15/2020 19:30    ASSESSMENT: Bone lesion.  PLAN:   1. Bone lesion: CT scan results from March 15, 2020 reviewed independently and report as above with multiple nonspecific lucent foci within the calvarium.   These are most likely benign, but will rule out underlying myeloma.  A metastatic bone survey has been ordered for completeness and is pending at time of dictation.  She has no evidence of endorgan damage with a normal CBC, creatinine, and calcium levels.  SPEP, immunoglobulins, and kappa/lambda light chains are pending at time of dictation.  No intervention is needed.  Patient does not require bone marrow biopsy at this time.  She will have a video assisted telemedicine visit in 3 weeks to discuss the results and any additional diagnostic testing necessary.  I spent a total of 45 minutes reviewing chart data, face-to-face evaluation with the patient, counseling and coordination of care as detailed above.   Patient expressed understanding and was in agreement with this plan. She also understands that She can call clinic at any time with any questions, concerns, or complaints.   Cancer Staging No matching staging information was found for the patient.  Lloyd Huger, MD   04/04/2020 4:15 PM

## 2020-04-04 ENCOUNTER — Ambulatory Visit
Admission: RE | Admit: 2020-04-04 | Discharge: 2020-04-04 | Disposition: A | Discharge status: Home / Self Care | Payer: BC Managed Care – PPO | Source: Ambulatory Visit | Attending: Oncology | Admitting: Oncology

## 2020-04-04 ENCOUNTER — Inpatient Hospital Stay: Payer: BC Managed Care – PPO

## 2020-04-04 ENCOUNTER — Other Ambulatory Visit: Payer: Self-pay

## 2020-04-04 ENCOUNTER — Encounter: Payer: Self-pay | Admitting: Oncology

## 2020-04-04 ENCOUNTER — Inpatient Hospital Stay: Payer: BC Managed Care – PPO | Attending: Oncology | Admitting: Oncology

## 2020-04-04 DIAGNOSIS — Z79899 Other long term (current) drug therapy: Secondary | ICD-10-CM | POA: Insufficient documentation

## 2020-04-04 DIAGNOSIS — R519 Headache, unspecified: Secondary | ICD-10-CM | POA: Insufficient documentation

## 2020-04-04 DIAGNOSIS — M899 Disorder of bone, unspecified: Secondary | ICD-10-CM

## 2020-04-04 DIAGNOSIS — Z8249 Family history of ischemic heart disease and other diseases of the circulatory system: Secondary | ICD-10-CM | POA: Insufficient documentation

## 2020-04-04 DIAGNOSIS — F17210 Nicotine dependence, cigarettes, uncomplicated: Secondary | ICD-10-CM | POA: Insufficient documentation

## 2020-04-04 DIAGNOSIS — Z809 Family history of malignant neoplasm, unspecified: Secondary | ICD-10-CM | POA: Insufficient documentation

## 2020-04-04 LAB — CBC WITH DIFFERENTIAL/PLATELET
Abs Immature Granulocytes: 0.02 10*3/uL (ref 0.00–0.07)
Basophils Absolute: 0.1 10*3/uL (ref 0.0–0.1)
Basophils Relative: 1 %
Eosinophils Absolute: 0.3 10*3/uL (ref 0.0–0.5)
Eosinophils Relative: 3 %
HCT: 38.7 % (ref 36.0–46.0)
Hemoglobin: 13.2 g/dL (ref 12.0–15.0)
Immature Granulocytes: 0 %
Lymphocytes Relative: 47 %
Lymphs Abs: 3.7 10*3/uL (ref 0.7–4.0)
MCH: 30.3 pg (ref 26.0–34.0)
MCHC: 34.1 g/dL (ref 30.0–36.0)
MCV: 88.8 fL (ref 80.0–100.0)
Monocytes Absolute: 0.7 10*3/uL (ref 0.1–1.0)
Monocytes Relative: 9 %
Neutro Abs: 3.2 10*3/uL (ref 1.7–7.7)
Neutrophils Relative %: 40 %
Platelets: 402 10*3/uL — ABNORMAL HIGH (ref 150–400)
RBC: 4.36 MIL/uL (ref 3.87–5.11)
RDW: 13.3 % (ref 11.5–15.5)
WBC: 7.9 10*3/uL (ref 4.0–10.5)
nRBC: 0 % (ref 0.0–0.2)

## 2020-04-04 LAB — COMPREHENSIVE METABOLIC PANEL
ALT: 54 U/L — ABNORMAL HIGH (ref 0–44)
AST: 34 U/L (ref 15–41)
Albumin: 3.7 g/dL (ref 3.5–5.0)
Alkaline Phosphatase: 72 U/L (ref 38–126)
Anion gap: 9 (ref 5–15)
BUN: 22 mg/dL (ref 8–23)
CO2: 28 mmol/L (ref 22–32)
Calcium: 9.8 mg/dL (ref 8.9–10.3)
Chloride: 103 mmol/L (ref 98–111)
Creatinine, Ser: 0.7 mg/dL (ref 0.44–1.00)
GFR calc Af Amer: >60 mL/min (ref >60)
GFR calc non Af Amer: >60 mL/min (ref >60)
Glucose, Bld: 94 mg/dL (ref 70–99)
Potassium: 3.6 mmol/L (ref 3.5–5.1)
Sodium: 140 mmol/L (ref 135–145)
Total Bilirubin: 0.5 mg/dL (ref 0.3–1.2)
Total Protein: 7.2 g/dL (ref 6.5–8.1)

## 2020-04-05 LAB — PROTEIN ELECTROPHORESIS, SERUM
A/G Ratio: 1.1 (ref 0.7–1.7)
Albumin ELP: 3.4 g/dL (ref 2.9–4.4)
Alpha-1-Globulin: 0.2 g/dL (ref 0.0–0.4)
Alpha-2-Globulin: 0.6 g/dL (ref 0.4–1.0)
Beta Globulin: 1.1 g/dL (ref 0.7–1.3)
Gamma Globulin: 1.1 g/dL (ref 0.4–1.8)
Globulin, Total: 3 g/dL (ref 2.2–3.9)
Total Protein ELP: 6.4 g/dL (ref 6.0–8.5)

## 2020-04-05 LAB — KAPPA/LAMBDA LIGHT CHAINS
Kappa free light chain: 25.5 mg/L — ABNORMAL HIGH (ref 3.3–19.4)
Kappa, lambda light chain ratio: 1.96 — ABNORMAL HIGH (ref 0.26–1.65)
Lambda free light chains: 13 mg/L (ref 5.7–26.3)

## 2020-04-05 LAB — IGG, IGA, IGM
IgA: 243 mg/dL (ref 87–352)
IgG (Immunoglobin G), Serum: 1118 mg/dL (ref 586–1602)
IgM (Immunoglobulin M), Srm: 51 mg/dL (ref 26–217)

## 2020-04-23 NOTE — Progress Notes (Signed)
[facility]  [phone] [phone]  ID: Brandi Wiggins OB: 08/09/1956  MR#: [id number]  CSN#:[id number]  Patient Care Team: Miles, Linda M, MD as PCP - General (Family Medicine)  I connected with Rechelle Slade Scarlett on 04/26/20 at  2:30 PM EDT by video enabled telemedicine visit and verified that I am speaking with the correct person using two identifiers.   I discussed the limitations, risks, security and privacy concerns of performing an evaluation and management service by telemedicine and the availability of in-person appointments. I also discussed with the patient that there may be a patient responsible charge related to this service. The patient expressed understanding and agreed to proceed.   Other persons participating in the visit and their role in the encounter: Patient, MD.  Patient's location: Work. Provider's location: Clinic.  CHIEF COMPLAINT: Bone lesions.  INTERVAL HISTORY: Patient agreed to video enabled telemedicine visit for further evaluation and discussion of her imaging and laboratory results.  She currently feels well and is asymptomatic.  She denies any further headaches.  She has no neurologic complaints.  She has a good appetite and denies weight loss.  She has no chest pain, shortness of breath, cough, or hemoptysis.  She denies any nausea, vomiting, constipation, or diarrhea.  She has no urinary complaints.  Patient offers no specific complaints today.  REVIEW OF SYSTEMS:   Review of Systems  Constitutional: Negative.  Negative for fever, malaise/fatigue and weight loss.  Respiratory: Negative.  Negative for cough, hemoptysis and shortness of breath.   Cardiovascular: Negative.  Negative for chest pain and leg swelling.  Gastrointestinal: Negative.  Negative for abdominal pain.  Genitourinary: Negative.  Negative for dysuria.  Musculoskeletal: Negative.  Negative for back pain.  Skin: Negative.  Negative for rash.    Neurological: Negative.  Negative for dizziness, focal weakness, weakness and headaches.  Psychiatric/Behavioral: Negative.  The patient is not nervous/anxious.     As per HPI. Otherwise, a complete review of systems is negative.  PAST MEDICAL HISTORY: Past Medical History:  Diagnosis Date  . Hypertension     PAST SURGICAL HISTORY: Past Surgical History:  Procedure Laterality Date  . SHOULDER SURGERY      FAMILY HISTORY: Family History  Problem Relation Age of Onset  . Cancer Mother   . CAD Father     ADVANCED DIRECTIVES (Y/N):  N  HEALTH MAINTENANCE: Social History   Tobacco Use  . Smoking status: Current Every Day Smoker    Packs/day: 0.50    Types: Cigarettes  . Smokeless tobacco: Never Used  Vaping Use  . Vaping Use: Never used  Substance Use Topics  . Alcohol use: Yes  . Drug use: No     Colonoscopy:  PAP:  Bone density:  Lipid panel:  No Known Allergies  Current Outpatient Medications  Medication Sig Dispense Refill  . ergotamine-caffeine (CAFERGOT) 1-100 MG tablet Take 1 tablet by mouth as needed.    . ibuprofen (ADVIL) 800 MG tablet Take 800 mg by mouth as needed.    . lisinopril-hydrochlorothiazide (ZESTORETIC) 20-12.5 MG tablet Take 1 tablet by mouth daily.    . losartan-hydrochlorothiazide (HYZAAR) 100-25 MG tablet Take 1 tablet by mouth daily.     No current facility-administered medications for this visit.    OBJECTIVE: There were no vitals filed for this visit.   There is no height or weight on file to calculate BMI.    ECOG FS:0 - Asymptomatic  General: Well-developed, well-nourished, no acute distress.   HEENT: Normocephalic. Neuro: Alert, answering all questions appropriately. Cranial nerves grossly intact. Psych: Normal affect.   LAB RESULTS:  Lab Results  Component Value Date   NA 140 04/04/2020   K 3.6 04/04/2020   CL 103 04/04/2020   CO2 28 04/04/2020   GLUCOSE 94 04/04/2020   BUN 22 04/04/2020   CREATININE 0.70  04/04/2020   CALCIUM 9.8 04/04/2020   PROT 7.2 04/04/2020   ALBUMIN 3.7 04/04/2020   AST 34 04/04/2020   ALT 54 (H) 04/04/2020   ALKPHOS 72 04/04/2020   BILITOT 0.5 04/04/2020   GFRNONAA >60 04/04/2020   GFRAA >60 04/04/2020    Lab Results  Component Value Date   WBC 7.9 04/04/2020   NEUTROABS 3.2 04/04/2020   HGB 13.2 04/04/2020   HCT 38.7 04/04/2020   MCV 88.8 04/04/2020   PLT 402 (H) 04/04/2020     STUDIES: DG Bone Survey Met  Result Date: 04/05/2020 CLINICAL DATA:  History of bone lesion. EXAM: METASTATIC BONE SURVEY COMPARISON:  Head CT 03/15/2020. FINDINGS: Standard imaging of the axial and appendicular skeleton obtained. Very subtle lucencies again noted throughout the skull as noted on recent CT. Faint lucencies noted over the proximal humeri. Focal lucency noted over the left glenoid. Focal lucency noted the mid left radius. Focal lucency noted the left greater trochanter. Diffuse degenerative change present. Old healed right mid humeral fracture. Large calcified fibroid. Implants noted in the soft tissues adjacent to the left humerus. Aortoiliac and peripheral vascular calcification. Carotid vascular calcification. Chest x-ray reveals no acute cardiopulmonary disease. IMPRESSION: 1. Subtle lucencies are noted throughout the skull, proximal humeri, left glenoid, left mid radius, left greater trochanter. Myeloma and or metastatic disease could present in this fashion. 2.  Large calcified uterine fibroid. 3.  Carotid, aortoiliac, and peripheral vascular disease. Electronically Signed   By: Marcello Moores  Register   On: 04/05/2020 09:59    ASSESSMENT: Bone lesions.  PLAN:   1. Bone lesions: Myeloma work-up is essentially negative.  Patient has a mildly elevated kappa free light chain level which is likely clinically insignificant.  She has no evidence of endorgan damage.  Metastatic bone survey from April 05, 2020 reviewed independently and reported as above with multiple bony  lucencies noted.  Patient reports normal mammogram at work earlier this year.  Will get a CT scan of the chest, abdomen, pelvis to assess if there is a primary lesion.  Patient will have video assisted telemedicine visit 1 to 2 days after imaging to discuss the results and any further diagnostic testing necessary.  2.  Thrombocytosis: Likely reactive, monitor.  I provided 20 minutes of face-to-face video visit time during this encounter which included chart review, counseling, and coordination of care as documented above.   Patient expressed understanding and was in agreement with this plan. She also understands that She can call clinic at any time with any questions, concerns, or complaints.   Cancer Staging No matching staging information was found for the patient.  Lloyd Huger, MD   04/26/2020 4:51 PM

## 2020-04-26 ENCOUNTER — Inpatient Hospital Stay: Payer: BC Managed Care – PPO | Attending: Oncology | Admitting: Oncology

## 2020-04-26 ENCOUNTER — Encounter: Payer: Self-pay | Admitting: Oncology

## 2020-04-26 DIAGNOSIS — M899 Disorder of bone, unspecified: Secondary | ICD-10-CM

## 2020-04-26 NOTE — Progress Notes (Signed)
Patient denies any pain or concerns today. 

## 2020-05-08 NOTE — Progress Notes (Signed)
[facility]  [phone] [phone]  ID: Brandi Wiggins OB: 1956-05-21  MR#: [id number]  OHY#:[id number]  Patient Care Team: Leanna Sato, MD as PCP - General (Family Medicine)  I connected with Johnsie Kindred Spreen on 05/13/20 at 10:30 AM EDT by video enabled telemedicine visit and verified that I am speaking with the correct person using two identifiers.   I discussed the limitations, risks, security and privacy concerns of performing an evaluation and management service by telemedicine and the availability of in-person appointments. I also discussed with the patient that there may be a patient responsible charge related to this service. The patient expressed understanding and agreed to proceed.   Other persons participating in the visit and their role in the encounter: Patient, MD.  Patient's location: Home. Provider's location: Clinic.  CHIEF COMPLAINT: Bone and liver lesions.  INTERVAL HISTORY: Patient agreed to video enabled telemedicine visit for further evaluation and discussion of her imaging results.  She continues to feel well and remains asymptomatic.  She is active and continues to work full-time.  She has no neurologic complaints.  She has a good appetite and denies weight loss.  She has no chest pain, shortness of breath, cough, or hemoptysis.  She denies any abdominal pain.  She denies any nausea, vomiting, constipation, or diarrhea.  She has no urinary complaints.  Patient feels at her baseline offers no specific complaints today.  REVIEW OF SYSTEMS:   Review of Systems  Constitutional: Negative.  Negative for fever, malaise/fatigue and weight loss.  Respiratory: Negative.  Negative for cough, hemoptysis and shortness of breath.   Cardiovascular: Negative.  Negative for chest pain and leg swelling.  Gastrointestinal: Negative.  Negative for abdominal pain.  Genitourinary: Negative.  Negative for dysuria.  Musculoskeletal:  Negative.  Negative for back pain.  Skin: Negative.  Negative for rash.  Neurological: Negative.  Negative for dizziness, focal weakness, weakness and headaches.  Psychiatric/Behavioral: Negative.  The patient is not nervous/anxious.     As per HPI. Otherwise, a complete review of systems is negative.  PAST MEDICAL HISTORY: Past Medical History:  Diagnosis Date  . Hypertension     PAST SURGICAL HISTORY: Past Surgical History:  Procedure Laterality Date  . SHOULDER SURGERY      FAMILY HISTORY: Family History  Problem Relation Age of Onset  . Cancer Mother   . CAD Father     ADVANCED DIRECTIVES (Y/N):  N  HEALTH MAINTENANCE: Social History   Tobacco Use  . Smoking status: Current Every Day Smoker    Packs/day: 0.50    Types: Cigarettes  . Smokeless tobacco: Never Used  Vaping Use  . Vaping Use: Never used  Substance Use Topics  . Alcohol use: Yes  . Drug use: No     Colonoscopy:  PAP:  Bone density:  Lipid panel:  No Known Allergies  Current Outpatient Medications  Medication Sig Dispense Refill  . ergotamine-caffeine (CAFERGOT) 1-100 MG tablet Take 1 tablet by mouth as needed.    Marland Kitchen ibuprofen (ADVIL) 800 MG tablet Take 800 mg by mouth as needed.    Marland Kitchen lisinopril-hydrochlorothiazide (ZESTORETIC) 20-12.5 MG tablet Take 1 tablet by mouth daily.    Marland Kitchen losartan-hydrochlorothiazide (HYZAAR) 100-25 MG tablet Take 1 tablet by mouth daily.     No current facility-administered medications for this visit.    OBJECTIVE: There were no vitals filed for this visit.   There is no height or weight on file to calculate BMI.  ECOG FS:0 - Asymptomatic  General: Well-developed, well-nourished, no acute distress. HEENT: Normocephalic. Neuro: Alert, answering all questions appropriately. Cranial nerves grossly intact. Psych: Normal affect.   LAB RESULTS:  Lab Results  Component Value Date   NA 140 04/04/2020   K 3.6 04/04/2020   CL 103 04/04/2020   CO2 28  04/04/2020   GLUCOSE 94 04/04/2020   BUN 22 04/04/2020   CREATININE 0.70 05/10/2020   CALCIUM 9.8 04/04/2020   PROT 7.2 04/04/2020   ALBUMIN 3.7 04/04/2020   AST 34 04/04/2020   ALT 54 (H) 04/04/2020   ALKPHOS 72 04/04/2020   BILITOT 0.5 04/04/2020   GFRNONAA >60 04/04/2020   GFRAA >60 04/04/2020    Lab Results  Component Value Date   WBC 7.9 04/04/2020   NEUTROABS 3.2 04/04/2020   HGB 13.2 04/04/2020   HCT 38.7 04/04/2020   MCV 88.8 04/04/2020   PLT 402 (H) 04/04/2020     STUDIES: CT CHEST ABDOMEN PELVIS W CONTRAST  Result Date: 05/10/2020 CLINICAL DATA:  Lucent lesions on radiographic bone survey, evaluate for primary lesion and metastatic disease EXAM: CT CHEST, ABDOMEN, AND PELVIS WITH CONTRAST TECHNIQUE: Multidetector CT imaging of the chest, abdomen and pelvis was performed following the standard protocol during bolus administration of intravenous contrast. CONTRAST:  OMNIPAQUE IOHEXOL 300 MG/ML SOLN, additional oral enteric contrast COMPARISON:  None. FINDINGS: CT CHEST FINDINGS Cardiovascular: Aortic atherosclerosis. Normal heart size. No pericardial effusion. Mediastinum/Nodes: No enlarged mediastinal, hilar, or axillary lymph nodes. Small hiatal hernia. Enlarged, heterogeneous thyroid. Trachea, and esophagus demonstrate no significant findings. Lungs/Pleura: Lungs are clear. No pleural effusion or pneumothorax. Musculoskeletal: No chest wall mass or suspicious bone lesions identified. CT ABDOMEN PELVIS FINDINGS Hepatobiliary: There are multiple hypodense liver lesions, the largest a subcapsular lesion of the peripheral right lobe of the liver, hepatic segment VII, demonstrating some suggestion of peripheral nodular enhancement and measuring 3.7 x 2.5 cm (series 2, image 53). Additional subtle hypodense lesion of the posterior liver dome, hepatic segment VII, measuring 0.9 x 0.9 cm (series 2, image 47). Simple cyst of the anterior right lobe of the liver (series 2, image  55). No gallstones, gallbladder wall thickening, or biliary dilatation. Pancreas: Unremarkable. No pancreatic ductal dilatation or surrounding inflammatory changes. Spleen: Normal in size without significant abnormality. Adrenals/Urinary Tract: Adrenal glands are unremarkable. Kidneys are normal, without renal calculi, solid lesion, or hydronephrosis. Bladder is unremarkable. Stomach/Bowel: Mucosal thickening or ingested material within the gastric fundus, measuring approximately 5.0 x 2.8 cm (series 2, image 47). Appendix appears normal. No evidence of bowel wall thickening, distention, or inflammatory changes. Occasional sigmoid diverticula. Vascular/Lymphatic: Aortic atherosclerosis. No enlarged abdominal or pelvic lymph nodes. Reproductive: Multiple uterine fibroids, including a large pedunculated fibroid. Other: No abdominal wall hernia or abnormality. No abdominopelvic ascites. Musculoskeletal: No acute or significant osseous findings. There is a benign, trabeculated hemangioma of the L5 vertebral body (series 5, image 93). IMPRESSION: 1. No definite evidence of malignancy in the chest, abdomen, or pelvis. 2. There is a subcapsular lesion of the peripheral right lobe of the liver, hepatic segment VII, demonstrating some suggestion of peripheral nodular enhancement and measuring 3.7 x 2.5 cm. Additional subtle hypodense lesion of the posterior liver dome, hepatic segment VII, measuring 0.9 x 0.9 cm. These findings are incompletely characterized. The larger lesion is suggestive of a hemangioma. Recommend multiphasic contrast enhanced MRI of the liver to further characterize. 3. Mucosal thickening or ingested material within the gastric fundus, measuring approximately 5.0 x 2.8 cm.  This finding is concerning for mass and may be further evaluated by endoscopy. 4. Multiple uterine fibroids, including a large pedunculated fibroid. 5. Osseous lesions described by radiographs are not appreciated by CT; radiographs are  more sensitive for the detection of subtle osseous lytic lesions. 6. Small hiatal hernia. 7. Enlarged, heterogeneous thyroid gland. Recommend thyroid ultrasound when clinically appropriate (ref: J Am Coll Radiol. 2015 Feb;12(2): 143-50). 8. Aortic Atherosclerosis (ICD10-I70.0). Electronically Signed   By: Lauralyn Primes M.D.   On: 05/10/2020 12:53    ASSESSMENT: Bone lesions.  PLAN:   1. Bone lesions: Myeloma work-up is essentially negative.  Patient has a mildly elevated kappa free light chain level which is likely clinically insignificant.  She has no evidence of endorgan damage.  Metastatic bone survey from April 05, 2020 reviewed independently with multiple bony lucencies noted.  Patient reports normal mammogram at work earlier this year.  CT scan results from May 10, 2020 reviewed independently and report as above were not appreciated.  2.  Thrombocytosis: Likely reactive, monitor. 3.  Liver lesions: Likely benign, but will further discuss at tumor board to assess if additional imaging is necessary.  Will call patient after discussion to discuss further follow-up.   I provided 20 minutes of face-to-face video visit time during this encounter which included chart review, counseling, and coordination of care as documented above.   Patient expressed understanding and was in agreement with this plan. She also understands that She can call clinic at any time with any questions, concerns, or complaints.   Cancer Staging No matching staging information was found for the patient.  Jeralyn Ruths, MD   05/13/2020 12:30 PM

## 2020-05-10 ENCOUNTER — Ambulatory Visit
Admission: RE | Admit: 2020-05-10 | Discharge: 2020-05-10 | Disposition: A | Discharge status: Home / Self Care | Payer: BC Managed Care – PPO | Source: Ambulatory Visit | Attending: Oncology | Admitting: Oncology

## 2020-05-10 ENCOUNTER — Other Ambulatory Visit: Payer: Self-pay

## 2020-05-10 DIAGNOSIS — M899 Disorder of bone, unspecified: Secondary | ICD-10-CM | POA: Diagnosis not present

## 2020-05-10 LAB — POCT I-STAT CREATININE: Creatinine, Ser: 0.7 mg/dL (ref 0.44–1.00)

## 2020-05-10 MED ORDER — IOHEXOL 300 MG/ML  SOLN
125.0000 mL | Freq: Once | INTRAMUSCULAR | Status: AC | PRN
  Administered 2020-05-10: 11:00:00 125 mL via INTRAVENOUS

## 2020-05-13 ENCOUNTER — Encounter: Payer: Self-pay | Admitting: Oncology

## 2020-05-13 ENCOUNTER — Inpatient Hospital Stay (HOSPITAL_BASED_OUTPATIENT_CLINIC_OR_DEPARTMENT_OTHER): Payer: BC Managed Care – PPO | Admitting: Oncology

## 2020-05-13 DIAGNOSIS — M899 Disorder of bone, unspecified: Secondary | ICD-10-CM | POA: Diagnosis not present

## 2020-05-13 NOTE — Progress Notes (Signed)
Patient denies any questions. Just states she has been having some pain in legs, shoulders and joints in arms. Rates pain at 7. Denies other concerns at this time.

## 2020-05-20 ENCOUNTER — Other Ambulatory Visit: Payer: Self-pay

## 2020-05-20 ENCOUNTER — Other Ambulatory Visit: Payer: Self-pay | Admitting: Oncology

## 2020-05-20 DIAGNOSIS — M899 Disorder of bone, unspecified: Secondary | ICD-10-CM

## 2020-05-20 DIAGNOSIS — K769 Liver disease, unspecified: Secondary | ICD-10-CM

## 2020-05-20 NOTE — Addendum Note (Signed)
Addended by: Asher Muir A on: 05/20/2020 09:37 AM   Modules accepted: Orders

## 2020-05-20 NOTE — Progress Notes (Signed)
mr

## 2020-06-03 NOTE — Progress Notes (Signed)
[facility]  [phone] [phone]) [phone]  ID: Verona Hartshorn OB: 1956-02-06  MR#: [id number]  XHB#:[id number]  Patient Care Team: Leanna Sato, MD as PCP - General (Family Medicine)  I connected with Johnsie Kindred Quevedo on 06/09/20 at  2:15 PM EDT by video enabled telemedicine visit and verified that I am speaking with the correct person using two identifiers.   I discussed the limitations, risks, security and privacy concerns of performing an evaluation and management service by telemedicine and the availability of in-person appointments. I also discussed with the patient that there may be a patient responsible charge related to this service. The patient expressed understanding and agreed to proceed.   Other persons participating in the visit and their role in the encounter: Patient, MD.  Patient's location: Work. Provider's location: Clinic.   CHIEF COMPLAINT: Bone and liver lesions.  INTERVAL HISTORY: Patient agreed to video enabled telemedicine visit for further evaluation and discussion of her imaging results.  She continues to feel well and remains asymptomatic.  She continues to be active and work full-time. She has no neurologic complaints.  She has a good appetite and denies weight loss.  She has no chest pain, shortness of breath, cough, or hemoptysis.  She denies any abdominal pain.  She denies any nausea, vomiting, constipation, or diarrhea.  She has no urinary complaints.  Patient feels at her baseline offers no specific complaints today.  REVIEW OF SYSTEMS:   Review of Systems  Constitutional: Negative.  Negative for fever, malaise/fatigue and weight loss.  Respiratory: Negative.  Negative for cough, hemoptysis and shortness of breath.   Cardiovascular: Negative.  Negative for chest pain and leg swelling.  Gastrointestinal: Negative.  Negative for abdominal pain.  Genitourinary: Negative.  Negative for dysuria.  Musculoskeletal:  Negative.  Negative for back pain.  Skin: Negative.  Negative for rash.  Neurological: Negative.  Negative for dizziness, focal weakness, weakness and headaches.  Psychiatric/Behavioral: Negative.  The patient is not nervous/anxious.     As per HPI. Otherwise, a complete review of systems is negative.  PAST MEDICAL HISTORY: Past Medical History:  Diagnosis Date  . Hypertension     PAST SURGICAL HISTORY: Past Surgical History:  Procedure Laterality Date  . SHOULDER SURGERY      FAMILY HISTORY: Family History  Problem Relation Age of Onset  . Cancer Mother   . CAD Father     ADVANCED DIRECTIVES (Y/N):  N  HEALTH MAINTENANCE: Social History   Tobacco Use  . Smoking status: Current Every Day Smoker    Packs/day: 0.50    Types: Cigarettes  . Smokeless tobacco: Never Used  Vaping Use  . Vaping Use: Never used  Substance Use Topics  . Alcohol use: Yes  . Drug use: No     Colonoscopy:  PAP:  Bone density:  Lipid panel:  No Known Allergies  Current Outpatient Medications  Medication Sig Dispense Refill  . ibuprofen (ADVIL) 800 MG tablet Take 800 mg by mouth as needed.    Marland Kitchen lisinopril-hydrochlorothiazide (ZESTORETIC) 20-12.5 MG tablet Take 1 tablet by mouth daily.    Marland Kitchen losartan-hydrochlorothiazide (HYZAAR) 100-25 MG tablet Take 1 tablet by mouth daily.    . ergotamine-caffeine (CAFERGOT) 1-100 MG tablet Take 1 tablet by mouth as needed.     No current facility-administered medications for this visit.    OBJECTIVE: There were no vitals filed for this visit.   There is no height or weight on file to calculate BMI.  ECOG FS:0 - Asymptomatic  General: Well-developed, well-nourished, no acute distress. HEENT: Normocephalic. Neuro: Alert, answering all questions appropriately. Cranial nerves grossly intact. Psych: Normal affect.   LAB RESULTS:  Lab Results  Component Value Date   NA 140 04/04/2020   K 3.6 04/04/2020   CL 103 04/04/2020   CO2 28  04/04/2020   GLUCOSE 94 04/04/2020   BUN 22 04/04/2020   CREATININE 0.70 05/10/2020   CALCIUM 9.8 04/04/2020   PROT 7.2 04/04/2020   ALBUMIN 3.7 04/04/2020   AST 34 04/04/2020   ALT 54 (H) 04/04/2020   ALKPHOS 72 04/04/2020   BILITOT 0.5 04/04/2020   GFRNONAA >60 04/04/2020   GFRAA >60 04/04/2020    Lab Results  Component Value Date   WBC 7.9 04/04/2020   NEUTROABS 3.2 04/04/2020   HGB 13.2 04/04/2020   HCT 38.7 04/04/2020   MCV 88.8 04/04/2020   PLT 402 (H) 04/04/2020     STUDIES: MR LIVER W WO CONTRAST  Result Date: 06/08/2020 CLINICAL DATA:  Indeterminate liver lesion for definitive characterization. EXAM: MRI ABDOMEN WITHOUT AND WITH CONTRAST TECHNIQUE: Multiplanar multisequence MR imaging of the abdomen was performed both before and after the administration of intravenous contrast. CONTRAST:  35mL GADAVIST GADOBUTROL 1 MMOL/ML IV SOLN COMPARISON:  Multiple exams, including CT scan 05/10/2020 FINDINGS: Lower chest: Unremarkable Hepatobiliary: There are 4 hemangiomas and 2 tiny cysts in the right hepatic lobe. The largest of the hemangiomas is subcapsular in location and measures 3.9 by 2.0 cm on image 15 series 6. The hemangiomas demonstrate classic enhancement pattern along with delayed enhancement. No worrisome liver lesion is identified. Numerous gallstones are present throughout the gallbladder. There are at least 8 stones visible in the common hepatic duct and common bile duct. The distal common bile duct measures up to 0.8 cm in diameter, mildly dilated. Pancreas:  Unremarkable Spleen:  Unremarkable Adrenals/Urinary Tract:  Unremarkable Stomach/Bowel: The area of concern along the stomach fundus is less striking on today's exam, and probably benign. Vascular/Lymphatic: Aortoiliac atherosclerotic vascular disease. No pathologic adenopathy identified. Other:  No supplemental non-categorized findings. Musculoskeletal: L2 and L5 hemangiomas. Mild lumbar spondylosis and  degenerative disc disease. IMPRESSION: 1. Four benign hemangiomas and 2 tiny cysts in the right hepatic lobe. No worrisome liver lesion. 2. Numerous gallstones are present in the gallbladder and there are at least 8 stones in the common hepatic duct and common bile duct. The CBD is mildly dilated. 3. The area of concern along the stomach fundus is less striking on today's exam, and probably benign. 4.  Aortic Atherosclerosis (ICD10-I70.0). 5. Hemangiomas at L2 and L5. Electronically Signed   By: Gaylyn Rong M.D.   On: 06/08/2020 10:37    ASSESSMENT: Bone and liver lesions.  PLAN:   1. Bone lesions: Myeloma work-up is essentially negative.  Patient has a mildly elevated kappa free light chain level which is likely clinically insignificant.  She has no evidence of endorgan damage.  Metastatic bone survey from April 05, 2020 reviewed independently with multiple bony lucencies noted.  Patient reports normal mammogram at work earlier this year.  CT scan results from May 10, 2020 reviewed independently and report as above were not appreciated.  No further intervention or imaging is needed. 2.  Thrombocytosis: Likely reactive, monitor. 3.  Liver lesions: MRI results from June 08, 2020 reviewed independently and reported as above revealing for benign hemangiomas and 2 benign cysts.  Patient does not have any worrisome liver lesions.  No further intervention is  needed.  No further follow-up is necessary.  Please refer patient back if there are any questions or concerns.   I provided 20 minutes of face-to-face video visit time during this encounter which included chart review, counseling, and coordination of care as documented above.   Patient expressed understanding and was in agreement with this plan. She also understands that She can call clinic at any time with any questions, concerns, or complaints.    Jeralyn Ruths, MD   06/09/2020 4:33 PM

## 2020-06-07 ENCOUNTER — Ambulatory Visit
Admission: RE | Admit: 2020-06-07 | Discharge: 2020-06-07 | Disposition: A | Discharge status: Home / Self Care | Payer: BC Managed Care – PPO | Source: Ambulatory Visit | Attending: Oncology | Admitting: Oncology

## 2020-06-07 ENCOUNTER — Other Ambulatory Visit: Payer: Self-pay

## 2020-06-07 DIAGNOSIS — K769 Liver disease, unspecified: Secondary | ICD-10-CM | POA: Insufficient documentation

## 2020-06-07 MED ORDER — GADOBUTROL 1 MMOL/ML IV SOLN
10.0000 mL | Freq: Once | INTRAVENOUS | Status: AC | PRN
  Administered 2020-06-07: 10 mL via INTRAVENOUS

## 2020-06-09 ENCOUNTER — Inpatient Hospital Stay: Payer: BC Managed Care – PPO | Attending: Oncology | Admitting: Oncology

## 2020-06-09 ENCOUNTER — Encounter: Payer: Self-pay | Admitting: Oncology

## 2020-06-09 DIAGNOSIS — M899 Disorder of bone, unspecified: Secondary | ICD-10-CM | POA: Diagnosis not present

## 2021-01-15 ENCOUNTER — Emergency Department (HOSPITAL_COMMUNITY): Payer: BC Managed Care – PPO

## 2021-01-15 ENCOUNTER — Encounter (HOSPITAL_COMMUNITY): Payer: Self-pay | Admitting: Emergency Medicine

## 2021-01-15 ENCOUNTER — Other Ambulatory Visit: Payer: Self-pay

## 2021-01-15 ENCOUNTER — Emergency Department (HOSPITAL_COMMUNITY)
Admission: EM | Admit: 2021-01-15 | Discharge: 2021-01-15 | Disposition: A | Discharge status: Home / Self Care | Payer: BC Managed Care – PPO | Attending: Emergency Medicine | Admitting: Emergency Medicine

## 2021-01-15 DIAGNOSIS — Z79899 Other long term (current) drug therapy: Secondary | ICD-10-CM | POA: Diagnosis not present

## 2021-01-15 DIAGNOSIS — I10 Essential (primary) hypertension: Secondary | ICD-10-CM | POA: Diagnosis not present

## 2021-01-15 DIAGNOSIS — W010XXA Fall on same level from slipping, tripping and stumbling without subsequent striking against object, initial encounter: Secondary | ICD-10-CM | POA: Insufficient documentation

## 2021-01-15 DIAGNOSIS — R000 Tachycardia, unspecified: Secondary | ICD-10-CM | POA: Insufficient documentation

## 2021-01-15 DIAGNOSIS — S4991XA Unspecified injury of right shoulder and upper arm, initial encounter: Secondary | ICD-10-CM | POA: Diagnosis present

## 2021-01-15 DIAGNOSIS — F17210 Nicotine dependence, cigarettes, uncomplicated: Secondary | ICD-10-CM | POA: Insufficient documentation

## 2021-01-15 DIAGNOSIS — Y9367 Activity, basketball: Secondary | ICD-10-CM | POA: Insufficient documentation

## 2021-01-15 DIAGNOSIS — M25512 Pain in left shoulder: Secondary | ICD-10-CM | POA: Diagnosis not present

## 2021-01-15 DIAGNOSIS — W19XXXA Unspecified fall, initial encounter: Secondary | ICD-10-CM

## 2021-01-15 DIAGNOSIS — S43004A Unspecified dislocation of right shoulder joint, initial encounter: Secondary | ICD-10-CM | POA: Diagnosis not present

## 2021-01-15 MED ORDER — FENTANYL CITRATE (PF) 100 MCG/2ML IJ SOLN
75.0000 ug | Freq: Once | INTRAMUSCULAR | Status: DC
  Filled 2021-01-15: qty 2

## 2021-01-15 MED ORDER — FENTANYL CITRATE (PF) 100 MCG/2ML IJ SOLN
75.0000 ug | Freq: Once | INTRAMUSCULAR | Status: AC
  Administered 2021-01-15: 75 ug via INTRAVENOUS
  Filled 2021-01-15: qty 2

## 2021-01-15 MED ORDER — HYDROMORPHONE HCL 1 MG/ML IJ SOLN
1.0000 mg | Freq: Once | INTRAMUSCULAR | Status: AC
  Administered 2021-01-15: 1 mg via INTRAVENOUS
  Filled 2021-01-15: qty 1

## 2021-01-15 MED ORDER — ONDANSETRON HCL 4 MG/2ML IJ SOLN
4.0000 mg | Freq: Once | INTRAMUSCULAR | Status: AC
  Administered 2021-01-15: 4 mg via INTRAVENOUS
  Filled 2021-01-15: qty 2

## 2021-01-15 MED ORDER — HYDROCODONE-ACETAMINOPHEN 5-325 MG PO TABS: 1.0000 | ORAL_TABLET | Freq: Four times a day (QID) | ORAL | 0 refills | Status: DC | PRN

## 2021-01-15 MED ORDER — PROPOFOL 10 MG/ML IV BOLUS
1.0000 mg/kg | Freq: Once | INTRAVENOUS | Status: AC
  Administered 2021-01-15: 5 mg via INTRAVENOUS
  Filled 2021-01-15: qty 20

## 2021-01-15 NOTE — ED Provider Notes (Addendum)
[facility]-EMERGENCY DEPT Provider Note   CSN: [id number] Arrival date & time: 01/15/21  1705     History Chief Complaint  Patient presents with  . Shoulder Injury    Brandi Wiggins is a 65 y.o. female.  HPI    Past Medical History:  Diagnosis Date  . Hypertension     Patient Active Problem List   Diagnosis Date Noted  . Bone lesion 04/01/2020    Past Surgical History:  Procedure Laterality Date  . SHOULDER SURGERY       OB History   No obstetric history on file.     Family History  Problem Relation Age of Onset  . Cancer Mother   . CAD Father     Social History   Tobacco Use  . Smoking status: Current Every Day Smoker    Packs/day: 0.50    Types: Cigarettes  . Smokeless tobacco: Never Used  Vaping Use  . Vaping Use: Never used  Substance Use Topics  . Alcohol use: Yes  . Drug use: No    Home Medications Prior to Admission medications   Medication Sig Start Date End Date Taking? Authorizing Provider  ergotamine-caffeine (CAFERGOT) 1-100 MG tablet Take 1 tablet by mouth as needed. 03/24/20   [provider]  ibuprofen (ADVIL) 800 MG tablet Take 800 mg by mouth as needed. 03/28/20   [provider]  lisinopril-hydrochlorothiazide (ZESTORETIC) 20-12.5 MG tablet Take 1 tablet by mouth daily.    [provider]  losartan-hydrochlorothiazide (HYZAAR) 100-25 MG tablet Take 1 tablet by mouth daily. 12/01/19   [provider]    Allergies    Patient has no known allergies.  Review of Systems   Review of Systems  Physical Exam Updated Vital Signs BP (!) 173/101   Pulse 97   Temp 97.8 F (36.6 C) (Oral)   Resp 16   Wt 122 kg   SpO2 94%   BMI 42.13 kg/m   Physical Exam  ED Results / Procedures / Treatments   Labs (all labs ordered are listed, but only abnormal results are displayed) Labs Reviewed - No data to display  EKG None  Radiology DG Shoulder Right  Result Date:  01/15/2021 CLINICAL DATA:  65 year old female with right shoulder deformity. EXAM: RIGHT SHOULDER - 2+ VIEW COMPARISON:  None. FINDINGS: There is anterior dislocation of the right shoulder. There is slight flattening of the superolateral humeral head. A Hill-Sachs injury is not excluded. The bones are osteopenic. The soft tissues are unremarkable. IMPRESSION: Anterior dislocation of the right shoulder. Electronically Signed   By: Elgie Collard M.D.   On: 01/15/2021 18:24   DG Shoulder Left  Result Date: 01/15/2021 CLINICAL DATA:  Recent shoulder injury with pain, initial encounter EXAM: LEFT SHOULDER - 2+ VIEW COMPARISON:  07/06/2014 FINDINGS: Degenerative changes of the acromioclavicular joint are noted. Mild degenerative changes of glenohumeral joint are noted as well. No acute fracture or dislocation is seen. No soft tissue abnormality is noted. IMPRESSION: Degenerative change without acute abnormality. Electronically Signed   By: Alcide Clever M.D.   On: 01/15/2021 18:25    Procedures .Sedation  Date/Time: 01/15/2021 7:13 PM Performed by: Koleen Distance, MD Authorized by: Koleen Distance, MD   Consent:    Consent given by:  Patient   Risks discussed:  Allergic reaction, dysrhythmia, inadequate sedation, nausea, prolonged hypoxia resulting in organ damage, prolonged sedation necessitating reversal, respiratory compromise necessitating ventilatory assistance and intubation and vomiting   Alternatives  discussed:  Analgesia without sedation and anxiolysis Universal protocol:    Procedure explained and questions answered to patient or proxy's satisfaction: yes     Relevant documents present and verified: yes     Test results available: yes     Imaging studies available: yes     Required blood products, implants, devices, and special equipment available: yes     Site/side marked: yes     Immediately prior to procedure, a time out was called: yes   Indications:    Procedure performed:   Dislocation reduction   Procedure necessitating sedation performed by:  Physician performing sedation Pre-sedation assessment:    Time since last food or drink:  3 hours   ASA classification: class 2 - patient with mild systemic disease     Mouth opening:  3 or more finger widths   Thyromental distance:  4 finger widths   Mallampati score:  III - soft palate, base of uvula visible   Neck mobility: normal     Pre-sedation assessments completed and reviewed: airway patency, cardiovascular function, hydration status, mental status, nausea/vomiting, pain level, respiratory function and temperature     Pre-sedation assessment completed:  01/15/2021 6:55 PM Immediate pre-procedure details:    Reassessment: Patient reassessed immediately prior to procedure     Reviewed: vital signs, relevant labs/tests and NPO status     Verified: bag valve mask available, emergency equipment available, intubation equipment available, IV patency confirmed, oxygen available and suction available   Procedure details (see MAR for exact dosages):    Preoxygenation:  Nasal cannula   Sedation:  Propofol   Intended level of sedation: moderate (conscious sedation)   Analgesia:  Hydromorphone   Intra-procedure monitoring:  Blood pressure monitoring, cardiac monitor, continuous capnometry, continuous pulse oximetry, frequent LOC assessments and frequent vital sign checks   Intra-procedure events: none     Total Provider sedation time (minutes):  20 Post-procedure details:    Post-sedation assessment completed:  01/15/2021 7:15 PM   Attendance: Constant attendance by certified staff until patient recovered     Recovery: Patient returned to pre-procedure baseline     Post-sedation assessments completed and reviewed: airway patency, cardiovascular function, hydration status, mental status, nausea/vomiting, pain level, respiratory function and temperature     Patient is stable for discharge or admission: yes     Procedure  completion:  Tolerated well, no immediate complications  .Ortho Injury Treatment  Date/Time: 01/15/2021 9:55 PM Performed by: Koleen Distance, MD Authorized by: Koleen Distance, MD   Consent:    Consent obtained:  Written   Consent given by:  Patient   Risks discussed:  Fracture, irreducible dislocation, nerve damage, recurrent dislocation, restricted joint movement, stiffness and vascular damage   Alternatives discussed:  No treatment, alternative treatment, immobilization, referral and delayed treatmentInjury location: shoulder Location details: right shoulder Injury type: dislocation Dislocation type: anterior Hill-Sachs deformity: yes Chronicity: new Pre-procedure neurovascular assessment: neurovascularly intact Pre-procedure distal perfusion: normal Pre-procedure neurological function: normal Pre-procedure range of motion: normal  Anesthesia: Local anesthesia used: no  Patient sedated: Yes. Refer to sedation procedure documentation for details of sedation. Manipulation performed: yes Reduction method: external rotation Reduction successful: yes X-ray confirmed reduction: yes Immobilization: sling Splint Applied by: ED Provider Post-procedure neurovascular assessment: post-procedure neurovascularly intact Post-procedure distal perfusion: normal Post-procedure neurological function: normal Post-procedure range of motion: normal      Medications Ordered in ED Medications  ondansetron (ZOFRAN) injection 4 mg (4 mg Intravenous Given 01/15/21 1749)  fentaNYL (SUBLIMAZE)  injection 75 mcg (75 mcg Intravenous Given 01/15/21 1749)  propofol (DIPRIVAN) 10 mg/mL bolus/IV push 122 mg (5 mg Intravenous Given 01/15/21 1916)  HYDROmorphone (DILAUDID) injection 1 mg (1 mg Intravenous Given 01/15/21 1905)    ED Course  I have reviewed the triage vital signs and the nursing notes.  Pertinent labs & imaging results that were available during my care of the patient were reviewed by me  and considered in my medical decision making (see chart for details).    MDM Rules/Calculators/A&P                           Final Clinical Impression(s) / ED Diagnoses Final diagnoses:  Dislocation of right shoulder joint, initial encounter    Rx / DC Orders ED Discharge Orders    None       Koleen Distance, MD 01/15/21 Norberta Keens    Koleen Distance, MD 01/15/21 2156

## 2021-01-15 NOTE — Discharge Instructions (Signed)
Do not hold any weight in your right arm. Wear the sling for support and immobilization. Call your orthopedist the next business day to schedule follow up appointment for close evaluation. You can take the hydrocodone as needed for more severe pain. Be aware this medication can make you drowsy. Do not drive while taking.  Apply ice to your shoulder for 20 min at a time. Return to the ED as needed.

## 2021-01-15 NOTE — ED Notes (Signed)
Procedural sedation consent signed. Patient connected to CO2 monitor  IVF going  This RN, Dr. Delford Field, Roxan Hockey PA, and RT in room. Timeout performed at 1900. Patient given 5mg  propofol at 1902. Patient sling applied at 1906. Xray notified for verification. Vitals WDL entire procedure.

## 2021-01-15 NOTE — ED Triage Notes (Signed)
BIBA Per EMS:  Pt coming from home with shoulder injury playing basketball with grandson. Pt fell and caught self with her arms ;  Obvious R shoulder deformity  178/102 115 HR  20 RR  97% RA 9/10 pain

## 2021-01-15 NOTE — ED Provider Notes (Signed)
[facility]-EMERGENCY DEPT Provider Note   CSN: [id number] Arrival date & time: 01/15/21  1705     History Chief Complaint  Patient presents with  . Shoulder Injury    Brandi Wiggins is a 65 y.o. female past medical history of hypertension, left rotator cuff repair, presenting for evaluation of right shoulder injury.  Patient states she was playing basketball with her grandson.  She slipped on the mud and fell forward catching herself with her outstretched arms.  She injured her right shoulder and believes it feels dislocated.  Has never dislocated her shoulder in the past.  She denies numbness to her arm.  She not hit her head or lose consciousness.  She is not on anticoagulation.  She has some soreness to her left shoulder though is less severe than the right.  EMS did not administer any medications in route.  No other injuries reported.  The history is provided by the patient.       Past Medical History:  Diagnosis Date  . Hypertension     Patient Active Problem List   Diagnosis Date Noted  . Bone lesion 04/01/2020    Past Surgical History:  Procedure Laterality Date  . SHOULDER SURGERY       OB History   No obstetric history on file.     Family History  Problem Relation Age of Onset  . Cancer Mother   . CAD Father     Social History   Tobacco Use  . Smoking status: Current Every Day Smoker    Packs/day: 0.50    Types: Cigarettes  . Smokeless tobacco: Never Used  Vaping Use  . Vaping Use: Never used  Substance Use Topics  . Alcohol use: Yes  . Drug use: No    Home Medications Prior to Admission medications   Medication Sig Start Date End Date Taking? Authorizing Provider  ergotamine-caffeine (CAFERGOT) 1-100 MG tablet Take 1 tablet by mouth as needed. 03/24/20   [provider]  ibuprofen (ADVIL) 800 MG tablet Take 800 mg by mouth as needed. 03/28/20   [provider]  lisinopril-hydrochlorothiazide (ZESTORETIC)  20-12.5 MG tablet Take 1 tablet by mouth daily.    [provider]  losartan-hydrochlorothiazide (HYZAAR) 100-25 MG tablet Take 1 tablet by mouth daily. 12/01/19   [provider]    Allergies    Patient has no known allergies.  Review of Systems   Review of Systems  Musculoskeletal: Positive for arthralgias.  All other systems reviewed and are negative.   Physical Exam Updated Vital Signs BP (!) 173/101   Pulse 97   Temp 97.8 F (36.6 C) (Oral)   Resp 16   Wt 122 kg   SpO2 94%   BMI 42.13 kg/m   Physical Exam Vitals and nursing note reviewed.  Constitutional:      General: She is not in acute distress.    Appearance: She is well-developed.  HENT:     Head: Normocephalic and atraumatic.  Eyes:     Conjunctiva/sclera: Conjunctivae normal.  Cardiovascular:     Rate and Rhythm: Regular rhythm. Tachycardia present.  Pulmonary:     Effort: Pulmonary effort is normal. No respiratory distress.     Breath sounds: Normal breath sounds.  Abdominal:     Palpations: Abdomen is soft.  Musculoskeletal:     Cervical back: Normal range of motion and neck supple.     Comments: Right shoulder with obvious deformity.  No wounds.  Left elbow  wrist and hand are atraumatic and nontender.  No deformity palpated along the clavicle Left shoulder with old surgical scar present.  There is some tenderness at the [facility] joint region.  Normal range of motion.  Elbow and wrist are atraumatic and nontender.   Skin:    General: Skin is warm.  Neurological:     Mental Status: She is alert.     Comments: Normal sensation over the deltoid region as well as to distal right upper extremity.  Strong and equal grip strength.  Psychiatric:        Behavior: Behavior normal.     ED Results / Procedures / Treatments   Labs (all labs ordered are listed, but only abnormal results are displayed) Labs Reviewed - No data to display  EKG None  Radiology DG Shoulder Right  Result Date:  01/15/2021 CLINICAL DATA:  65 year old female with right shoulder deformity. EXAM: RIGHT SHOULDER - 2+ VIEW COMPARISON:  None. FINDINGS: There is anterior dislocation of the right shoulder. There is slight flattening of the superolateral humeral head. A Hill-Sachs injury is not excluded. The bones are osteopenic. The soft tissues are unremarkable. IMPRESSION: Anterior dislocation of the right shoulder. Electronically Signed   By: Elgie Collard M.D.   On: 01/15/2021 18:24   DG Shoulder Left  Result Date: 01/15/2021 CLINICAL DATA:  Recent shoulder injury with pain, initial encounter EXAM: LEFT SHOULDER - 2+ VIEW COMPARISON:  07/06/2014 FINDINGS: Degenerative changes of the acromioclavicular joint are noted. Mild degenerative changes of glenohumeral joint are noted as well. No acute fracture or dislocation is seen. No soft tissue abnormality is noted. IMPRESSION: Degenerative change without acute abnormality. Electronically Signed   By: Alcide Clever M.D.   On: 01/15/2021 18:25   DG Shoulder Right Portable  Result Date: 01/15/2021 CLINICAL DATA:  Confirm right shoulder reduction. EXAM: PORTABLE RIGHT SHOULDER COMPARISON:  Earlier the same day. FINDINGS: Interval reduction of the right shoulder. No definite fracture seen radiographically. IMPRESSION: Interval successful reduction of the right shoulder. No definite fracture seen radiographically. Electronically Signed   By: Ted Mcalpine M.D.   On: 01/15/2021 19:47    Procedures Reduction of dislocation  Date/Time: 01/15/2021 7:13 PM Performed by: Saraiya Kozma, [place] N, PA-C Authorized by: Leonarda Leis, [place] N, PA-C  Consent: Verbal consent obtained. Risks and benefits: risks, benefits and alternatives were discussed Consent given by: patient Patient understanding: patient states understanding of the procedure being performed Patient consent: the patient's understanding of the procedure matches consent given Imaging studies: imaging studies  available Required items: required blood products, implants, devices, and special equipment available Patient identity confirmed: verbally with patient Time out: Immediately prior to procedure a "time out" was called to verify the correct patient, procedure, equipment, support staff and site/side marked as required.  Sedation: Patient sedated: yes (see separate procedural sedation note by Dr. Delford Field) Sedation type: moderate (conscious) sedation  Patient tolerance: patient tolerated the procedure well with no immediate complications Comments: Traction was placed on right arm with external rotation for successful reduction. Joint did appear quite lax with reduction       Medications Ordered in ED Medications  ondansetron (ZOFRAN) injection 4 mg (4 mg Intravenous Given 01/15/21 1749)  fentaNYL (SUBLIMAZE) injection 75 mcg (75 mcg Intravenous Given 01/15/21 1749)  propofol (DIPRIVAN) 10 mg/mL bolus/IV push 122 mg (5 mg Intravenous Given 01/15/21 1916)  HYDROmorphone (DILAUDID) injection 1 mg (1 mg Intravenous Given 01/15/21 1905)    ED Course  I have reviewed the triage vital signs  and the nursing notes.  Pertinent labs & imaging results that were available during my care of the patient were reviewed by me and considered in my medical decision making (see chart for details).    MDM Rules/Calculators/A&P                          Patient is a 65 yo female here with closed right anterior shoulder dislocation after mechanical fall that occurred PTA. Not on thinners, no head trauma. Some pain to left shoulder, hx of rotator cuff repair. Upper extremities with intact neurovascular status. Will perform reduction under procedural sedation.  Successful reduction, no complications with procedural sedation. Patient tolerated well. Post-reduction film shows successful reduction. Pain improved, remains NV intact. Patient monitored in the ED until back to baseline after sedation. Patient followed  outpatient [facility] for [facility]. Recommend she call [facility] to schedule a f/u appointment. Ice, nonweightbearing right arm, OTC medications for pain. Norco for breakthrough pain.  [place] Controlled Substance reporting System queried  Discussed results, findings, treatment and follow up. Patient advised of return precautions. Patient verbalized understanding and agreed with plan.   Final Clinical Impression(s) / ED Diagnoses Final diagnoses:  Dislocation of right shoulder joint, initial encounter  Fall, initial encounter    Rx / DC Orders ED Discharge Orders    None       Tyniah Kastens, [place] N, PA-C 01/15/21 2119    Koleen Distance, MD 01/15/21 2156

## 2021-02-01 ENCOUNTER — Other Ambulatory Visit: Payer: Self-pay | Admitting: Student

## 2021-02-01 DIAGNOSIS — S43014A Anterior dislocation of right humerus, initial encounter: Secondary | ICD-10-CM

## 2021-02-01 DIAGNOSIS — S46011A Strain of muscle(s) and tendon(s) of the rotator cuff of right shoulder, initial encounter: Secondary | ICD-10-CM

## 2021-02-06 ENCOUNTER — Other Ambulatory Visit: Payer: Self-pay | Admitting: Student

## 2021-02-06 DIAGNOSIS — S46012A Strain of muscle(s) and tendon(s) of the rotator cuff of left shoulder, initial encounter: Secondary | ICD-10-CM

## 2021-02-06 DIAGNOSIS — Z9889 Other specified postprocedural states: Secondary | ICD-10-CM

## 2021-02-10 ENCOUNTER — Other Ambulatory Visit: Payer: Self-pay | Admitting: Student

## 2021-02-10 ENCOUNTER — Ambulatory Visit
Admission: RE | Admit: 2021-02-10 | Discharge: 2021-02-10 | Disposition: A | Discharge status: Home / Self Care | Payer: BC Managed Care – PPO | Source: Ambulatory Visit | Attending: Student | Admitting: Student

## 2021-02-10 ENCOUNTER — Other Ambulatory Visit: Payer: BC Managed Care – PPO

## 2021-02-10 ENCOUNTER — Other Ambulatory Visit: Payer: Self-pay

## 2021-02-10 DIAGNOSIS — X58XXXA Exposure to other specified factors, initial encounter: Secondary | ICD-10-CM | POA: Diagnosis not present

## 2021-02-10 DIAGNOSIS — X58XXXD Exposure to other specified factors, subsequent encounter: Secondary | ICD-10-CM | POA: Diagnosis not present

## 2021-02-10 DIAGNOSIS — S46012A Strain of muscle(s) and tendon(s) of the rotator cuff of left shoulder, initial encounter: Secondary | ICD-10-CM | POA: Diagnosis not present

## 2021-02-10 DIAGNOSIS — M25412 Effusion, left shoulder: Secondary | ICD-10-CM | POA: Diagnosis not present

## 2021-02-10 DIAGNOSIS — S46011A Strain of muscle(s) and tendon(s) of the rotator cuff of right shoulder, initial encounter: Secondary | ICD-10-CM

## 2021-02-10 DIAGNOSIS — S43014A Anterior dislocation of right humerus, initial encounter: Secondary | ICD-10-CM

## 2021-02-10 DIAGNOSIS — S43432D Superior glenoid labrum lesion of left shoulder, subsequent encounter: Secondary | ICD-10-CM | POA: Diagnosis not present

## 2021-02-10 DIAGNOSIS — M659 Synovitis and tenosynovitis, unspecified: Secondary | ICD-10-CM | POA: Insufficient documentation

## 2021-02-10 DIAGNOSIS — Z9889 Other specified postprocedural states: Secondary | ICD-10-CM

## 2021-02-10 MED ORDER — IOHEXOL 180 MG/ML  SOLN
20.0000 mL | Freq: Once | INTRAMUSCULAR | Status: AC | PRN
  Administered 2021-02-10: 13 mL

## 2021-02-10 MED ORDER — LIDOCAINE HCL (PF) 1 % IJ SOLN
30.0000 mL | Freq: Once | INTRAMUSCULAR | Status: AC
  Administered 2021-02-10: 15 mL
  Filled 2021-02-10: qty 30

## 2021-02-10 MED ORDER — SODIUM CHLORIDE (PF) 0.9 % IJ SOLN
10.0000 mL | Freq: Once | INTRAMUSCULAR | Status: AC
  Administered 2021-02-10: 7 mL

## 2021-02-10 MED ORDER — GADOBUTROL 1 MMOL/ML IV SOLN
0.0500 mL | Freq: Once | INTRAVENOUS | Status: AC | PRN
  Administered 2021-02-10: 0.05 mL

## 2021-03-10 ENCOUNTER — Other Ambulatory Visit: Payer: Self-pay | Admitting: Surgery

## 2021-03-24 ENCOUNTER — Encounter
Admission: RE | Admit: 2021-03-24 | Discharge: 2021-03-24 | Disposition: A | Discharge status: Home / Self Care | Payer: BC Managed Care – PPO | Source: Ambulatory Visit | Attending: Surgery | Admitting: Surgery

## 2021-03-24 ENCOUNTER — Other Ambulatory Visit: Payer: Self-pay

## 2021-03-24 DIAGNOSIS — Z01818 Encounter for other preprocedural examination: Secondary | ICD-10-CM | POA: Diagnosis not present

## 2021-03-24 LAB — COMPREHENSIVE METABOLIC PANEL
ALT: 30 U/L (ref 0–44)
AST: 25 U/L (ref 15–41)
Albumin: 4.1 g/dL (ref 3.5–5.0)
Alkaline Phosphatase: 79 U/L (ref 38–126)
Anion gap: 6 (ref 5–15)
BUN: 15 mg/dL (ref 8–23)
CO2: 30 mmol/L (ref 22–32)
Calcium: 10.3 mg/dL (ref 8.9–10.3)
Chloride: 102 mmol/L (ref 98–111)
Creatinine, Ser: 0.69 mg/dL (ref 0.44–1.00)
GFR, Estimated: >60 mL/min (ref >60)
Glucose, Bld: 90 mg/dL (ref 70–99)
Potassium: 3.4 mmol/L — ABNORMAL LOW (ref 3.5–5.1)
Sodium: 138 mmol/L (ref 135–145)
Total Bilirubin: 0.8 mg/dL (ref 0.3–1.2)
Total Protein: 7.3 g/dL (ref 6.5–8.1)

## 2021-03-24 LAB — CBC WITH DIFFERENTIAL/PLATELET
Abs Immature Granulocytes: 0.01 10*3/uL (ref 0.00–0.07)
Basophils Absolute: 0 10*3/uL (ref 0.0–0.1)
Basophils Relative: 1 %
Eosinophils Absolute: 0.1 10*3/uL (ref 0.0–0.5)
Eosinophils Relative: 2 %
HCT: 39 % (ref 36.0–46.0)
Hemoglobin: 13.6 g/dL (ref 12.0–15.0)
Immature Granulocytes: 0 %
Lymphocytes Relative: 46 %
Lymphs Abs: 3.2 10*3/uL (ref 0.7–4.0)
MCH: 31.3 pg (ref 26.0–34.0)
MCHC: 34.9 g/dL (ref 30.0–36.0)
MCV: 89.7 fL (ref 80.0–100.0)
Monocytes Absolute: 0.5 10*3/uL (ref 0.1–1.0)
Monocytes Relative: 8 %
Neutro Abs: 2.9 10*3/uL (ref 1.7–7.7)
Neutrophils Relative %: 43 %
Platelets: 364 10*3/uL (ref 150–400)
RBC: 4.35 MIL/uL (ref 3.87–5.11)
RDW: 12.7 % (ref 11.5–15.5)
WBC: 6.7 10*3/uL (ref 4.0–10.5)
nRBC: 0 % (ref 0.0–0.2)

## 2021-03-24 LAB — URINALYSIS, ROUTINE W REFLEX MICROSCOPIC
Bilirubin Urine: NEGATIVE
Glucose, UA: NEGATIVE mg/dL
Ketones, ur: NEGATIVE mg/dL
Nitrite: POSITIVE — AB
Protein, ur: 30 mg/dL — AB
Specific Gravity, Urine: 1.021 (ref 1.005–1.030)
pH: 6 (ref 5.0–8.0)

## 2021-03-24 LAB — SURGICAL PCR SCREEN
MRSA, PCR: NEGATIVE
Staphylococcus aureus: NEGATIVE

## 2021-03-24 LAB — TYPE AND SCREEN
ABO/RH(D): O POS
Antibody Screen: NEGATIVE

## 2021-03-24 NOTE — Progress Notes (Signed)
  [facility] Perioperative Services: Pre-Admission/Anesthesia Testing  Abnormal Lab Notification   Date: 03/24/21  Name: Brandi Wiggins MRN:   [id number]  Re: Abnormal labs noted during PAT appointment   Provider(s) Notified: Poggi, Excell Seltzer, MD and Horris Latino, PA-C Notification mode: Routed and/or faxed via [facility]   ABNORMAL LAB VALUE(S): Lab Results  Component Value Date   COLORURINE AMBER (A) 03/24/2021   APPEARANCEUR CLOUDY (A) 03/24/2021   LABSPEC 1.021 03/24/2021   PHURINE 6.0 03/24/2021   GLUCOSEU NEGATIVE 03/24/2021   HGBUR SMALL (A) 03/24/2021   BILIRUBINUR NEGATIVE 03/24/2021   KETONESUR NEGATIVE 03/24/2021   PROTEINUR 30 (A) 03/24/2021   NITRITE POSITIVE (A) 03/24/2021   LEUKOCYTESUR SMALL (A) 03/24/2021   EPIU 6-10 03/24/2021   WBCU 21-50 03/24/2021   RBCU 0-5 03/24/2021   BACTERIA MANY (A) 03/24/2021   Notes:  Patient is scheduled for a REVERSE SHOULDER ARTHROPLASTY (Right: Shoulder) on 04/04/2021.   UA performed in PAT consistent with/concerning for infection.  No leukocytosis noted on CBC; WBC 6700 Renal function normal; Cr 0.69 mg/dL Urine C&S added to assess for pathogenically significant growth.  Will forward UA, and subsequent C&S results, to attending surgeon and his APP for review and Tx as deemed appropriate.   This is a Personal assistant; no formal response is required.  Quentin Mulling, MSN, APRN, FNP-C, CEN [facility]  Peri-operative Services Nurse Practitioner Phone: [phone] Fax: [phone] 03/24/21 2:05 PM

## 2021-03-24 NOTE — Patient Instructions (Signed)
Your procedure is scheduled on: 04/04/21 Report to the Registration Desk on the 1st floor of the [facility]. To find out your arrival time, please call [phone] between 1PM - 3PM on: 04/03/2021   REMEMBER: Instructions that are not followed completely may result in serious medical risk, up to and including death; or upon the discretion of your surgeon and anesthesiologist your surgery may need to be rescheduled.  Do not eat food after midnight the night before surgery.  No gum chewing, lozengers or hard candies.  You may however, drink CLEAR liquids up to 2 hours before you are scheduled to arrive for your surgery. Do not drink anything within 2 hours of your scheduled arrival time.  Clear liquids include: - water  - apple juice without pulp - gatorade (not RED, PURPLE, OR BLUE) - black coffee or tea (Do NOT add milk or creamers to the coffee or tea) Do NOT drink anything that is not on this list.      In addition, your doctor has ordered for you to drink the provided  Ensure Pre-Surgery Clear Carbohydrate Drink    Drinking this carbohydrate drink up to two hours before surgery helps to reduce insulin resistance and improve patient outcomes. Please complete drinking 2 hours prior to scheduled arrival time.  TAKE THESE MEDICATIONS THE MORNING OF SURGERY WITH A SIP OF WATER:   HYDROcodone-acetaminophen - if needed       One week prior to surgery: Stop Anti-inflammatories (NSAIDS) such as Advil, Aleve, Ibuprofen, Motrin, Naproxen, Naprosyn and Aspirin based products such as Excedrin, Goodys Powder, BC Powder.  Stop ANY OVER THE COUNTER supplements until after surgery. You may however, continue to take Tylenol if needed for pain up until the day of surgery.  No Alcohol for 24 hours before or after surgery.  No Smoking including e-cigarettes for 24 hours prior to surgery.  No chewable tobacco products for at least 6 hours prior to surgery.  No nicotine patches on the day  of surgery.  Do not use any "recreational" drugs for at least a week prior to your surgery.  Please be advised that the combination of cocaine and anesthesia may have negative outcomes, up to and including death. If you test positive for cocaine, your surgery will be cancelled.  On the morning of surgery brush your teeth with toothpaste and water, you may rinse your mouth with mouthwash if you wish. Do not swallow any toothpaste or mouthwash.  Do not wear jewelry, make-up, hairpins, clips or nail polish.  Do not wear lotions, powders, or perfumes.   Do not shave body from the neck down 48 hours prior to surgery just in case you cut yourself which could leave a site for infection.  Also, freshly shaved skin may become irritated if using the CHG soap.  Contact lenses, hearing aids and dentures may not be worn into surgery.  Do not bring valuables to the hospital. [facility] is not responsible for any missing/lost belongings or valuables.   Use CHG Soap or wipes as directed on instruction sheet. -provided for you  Total Shoulder Arthroplasty:  use Benzolyl Peroxide 5% Gel as directed on instruction sheet. - provided for you     Notify your doctor if there is any change in your medical condition (cold, fever, infection).  Wear comfortable clothing (specific to your surgery type) to the hospital.  After surgery, you can help prevent lung complications by doing breathing exercises.  Take deep breaths and cough every 1-2  hours. Your doctor may order a device called an Incentive Spirometer to help you take deep breaths.  If you are being admitted to the hospital overnight, leave your suitcase in the car. After surgery it may be brought to your room.  If you are being discharged the day of surgery, you will not be allowed to drive home. You will need a responsible adult (18 years or older) to drive you home and stay with you that night.   Please call the Pre-admissions Testing Dept. at  [phone] if you have any questions about these instructions.  Surgery Visitation Policy:  Patients undergoing a surgery or procedure may have one family member or support person with them as long as that person is not COVID-19 positive or experiencing its symptoms.  That person may remain in the waiting area during the procedure.  Inpatient Visitation:    Visiting hours are 7 a.m. to 8 p.m. Inpatients will be allowed two visitors daily. The visitors may change each day during the patient's stay. No visitors under the age of 36. Any visitor under the age of 76 must be accompanied by an adult. The visitor must pass COVID-19 screenings, use hand sanitizer when entering and exiting the patient's room and wear a mask at all times, including in the patient's room. Patients must also wear a mask when staff or their visitor are in the room. Masking is required regardless of vaccination status.

## 2021-03-26 LAB — URINE CULTURE: Culture: >=100000 — AB

## 2021-03-31 ENCOUNTER — Other Ambulatory Visit: Payer: Self-pay

## 2021-03-31 ENCOUNTER — Other Ambulatory Visit
Admission: RE | Admit: 2021-03-31 | Discharge: 2021-03-31 | Disposition: A | Discharge status: Home / Self Care | Payer: BC Managed Care – PPO | Source: Ambulatory Visit | Attending: Surgery | Admitting: Surgery

## 2021-03-31 DIAGNOSIS — Z01812 Encounter for preprocedural laboratory examination: Secondary | ICD-10-CM | POA: Insufficient documentation

## 2021-03-31 DIAGNOSIS — Z20822 Contact with and (suspected) exposure to covid-19: Secondary | ICD-10-CM | POA: Insufficient documentation

## 2021-03-31 LAB — SARS CORONAVIRUS 2 (TAT 6-24 HRS): SARS Coronavirus 2: NEGATIVE

## 2021-04-03 MED ORDER — CEFAZOLIN IN SODIUM CHLORIDE 3-0.9 GM/100ML-% IV SOLN
3.0000 g | INTRAVENOUS | Status: DC
  Filled 2021-04-03: qty 100

## 2021-04-04 ENCOUNTER — Observation Stay: Payer: BC Managed Care – PPO

## 2021-04-04 ENCOUNTER — Encounter
Admission: RE | Disposition: A | Discharge status: Home / Self Care | Payer: Self-pay | Source: Home / Self Care | Attending: Surgery

## 2021-04-04 ENCOUNTER — Encounter: Payer: Self-pay | Admitting: Surgery

## 2021-04-04 ENCOUNTER — Inpatient Hospital Stay: Payer: BC Managed Care – PPO

## 2021-04-04 ENCOUNTER — Inpatient Hospital Stay: Payer: BC Managed Care – PPO | Admitting: Urgent Care

## 2021-04-04 ENCOUNTER — Inpatient Hospital Stay: Payer: BC Managed Care – PPO | Admitting: Anesthesiology

## 2021-04-04 ENCOUNTER — Other Ambulatory Visit: Payer: Self-pay

## 2021-04-04 ENCOUNTER — Inpatient Hospital Stay
Admission: RE | Admit: 2021-04-04 | Discharge: 2021-04-05 | DRG: 483 | Disposition: A | Discharge status: Home / Self Care | Payer: BC Managed Care – PPO | Attending: Surgery | Admitting: Surgery

## 2021-04-04 DIAGNOSIS — X58XXXD Exposure to other specified factors, subsequent encounter: Secondary | ICD-10-CM | POA: Diagnosis present

## 2021-04-04 DIAGNOSIS — Z8249 Family history of ischemic heart disease and other diseases of the circulatory system: Secondary | ICD-10-CM

## 2021-04-04 DIAGNOSIS — Z79899 Other long term (current) drug therapy: Secondary | ICD-10-CM | POA: Diagnosis not present

## 2021-04-04 DIAGNOSIS — M19011 Primary osteoarthritis, right shoulder: Secondary | ICD-10-CM | POA: Diagnosis present

## 2021-04-04 DIAGNOSIS — I10 Essential (primary) hypertension: Secondary | ICD-10-CM | POA: Diagnosis present

## 2021-04-04 DIAGNOSIS — Z419 Encounter for procedure for purposes other than remedying health state, unspecified: Secondary | ICD-10-CM

## 2021-04-04 DIAGNOSIS — M19111 Post-traumatic osteoarthritis, right shoulder: Secondary | ICD-10-CM | POA: Diagnosis present

## 2021-04-04 DIAGNOSIS — Z96611 Presence of right artificial shoulder joint: Secondary | ICD-10-CM

## 2021-04-04 HISTORY — PX: REVERSE SHOULDER ARTHROPLASTY: SHX5054

## 2021-04-04 LAB — ABO/RH: ABO/RH(D): O POS

## 2021-04-04 SURGERY — ARTHROPLASTY, SHOULDER, TOTAL, REVERSE
Anesthesia: General | Site: Shoulder | Laterality: Right

## 2021-04-04 MED ORDER — DEXAMETHASONE SODIUM PHOSPHATE 10 MG/ML IJ SOLN
INTRAMUSCULAR | Status: DC | PRN
  Administered 2021-04-04: 10 mg via INTRAVENOUS

## 2021-04-04 MED ORDER — ONDANSETRON HCL 4 MG PO TABS: 4.0000 mg | ORAL_TABLET | Freq: Four times a day (QID) | ORAL | Status: DC | PRN

## 2021-04-04 MED ORDER — SODIUM CHLORIDE 0.9 % IV SOLN
INTRAVENOUS | Status: DC | PRN
  Administered 2021-04-04: 20 ug/min via INTRAVENOUS
  Administered 2021-04-04: 50 ug/min via INTRAVENOUS

## 2021-04-04 MED ORDER — METOCLOPRAMIDE HCL 5 MG/ML IJ SOLN: 5.0000 mg | Freq: Three times a day (TID) | INTRAMUSCULAR | Status: DC | PRN

## 2021-04-04 MED ORDER — LOSARTAN POTASSIUM 50 MG PO TABS
100.0000 mg | ORAL_TABLET | Freq: Every day | ORAL | Status: DC
  Administered 2021-04-04 – 2021-04-05 (×2): 100 mg via ORAL
  Filled 2021-04-04 (×2): qty 2

## 2021-04-04 MED ORDER — FENTANYL CITRATE (PF) 100 MCG/2ML IJ SOLN
INTRAMUSCULAR | Status: AC
  Filled 2021-04-04: qty 2

## 2021-04-04 MED ORDER — ORAL CARE MOUTH RINSE: 15.0000 mL | Freq: Once | OROMUCOSAL | Status: AC

## 2021-04-04 MED ORDER — TRANEXAMIC ACID 1000 MG/10ML IV SOLN
INTRAVENOUS | Status: DC | PRN
  Administered 2021-04-04: 1000 mg via TOPICAL

## 2021-04-04 MED ORDER — SUCCINYLCHOLINE CHLORIDE 200 MG/10ML IV SOSY
PREFILLED_SYRINGE | INTRAVENOUS | Status: DC | PRN
  Administered 2021-04-04: 120 mg via INTRAVENOUS

## 2021-04-04 MED ORDER — FENTANYL CITRATE (PF) 100 MCG/2ML IJ SOLN: 50.0000 ug | INTRAMUSCULAR | Status: DC | PRN

## 2021-04-04 MED ORDER — OXYCODONE HCL 5 MG/5ML PO SOLN
5.0000 mg | Freq: Once | ORAL | Status: AC | PRN
Start: 2021-04-04 — End: 2021-04-04

## 2021-04-04 MED ORDER — HYDROCODONE-ACETAMINOPHEN 5-325 MG PO TABS
1.0000 | ORAL_TABLET | ORAL | Status: DC | PRN
Start: 2021-04-04 — End: 2021-04-05
  Administered 2021-04-04: 1 via ORAL
  Administered 2021-04-05: 2 via ORAL
  Filled 2021-04-04: qty 2
  Filled 2021-04-04: qty 1

## 2021-04-04 MED ORDER — DOCUSATE SODIUM 100 MG PO CAPS
100.0000 mg | ORAL_CAPSULE | Freq: Two times a day (BID) | ORAL | Status: DC
  Administered 2021-04-04 – 2021-04-05 (×3): 100 mg via ORAL
  Filled 2021-04-04 (×3): qty 1

## 2021-04-04 MED ORDER — BUPIVACAINE HCL (PF) 0.5 % IJ SOLN
INTRAMUSCULAR | Status: AC
  Filled 2021-04-04: qty 10

## 2021-04-04 MED ORDER — FENTANYL CITRATE (PF) 100 MCG/2ML IJ SOLN
INTRAMUSCULAR | Status: AC
  Administered 2021-04-04: 50 ug via INTRAVENOUS
  Filled 2021-04-04: qty 2

## 2021-04-04 MED ORDER — MAGNESIUM HYDROXIDE 400 MG/5ML PO SUSP: 30.0000 mL | Freq: Every day | ORAL | Status: DC | PRN

## 2021-04-04 MED ORDER — DEXTROSE 5 % IV SOLN
INTRAVENOUS | Status: DC | PRN
  Administered 2021-04-04: 3 g via INTRAVENOUS

## 2021-04-04 MED ORDER — EPHEDRINE SULFATE 50 MG/ML IJ SOLN
INTRAMUSCULAR | Status: DC | PRN
  Administered 2021-04-04 (×2): 10 mg via INTRAVENOUS

## 2021-04-04 MED ORDER — MIDAZOLAM HCL 2 MG/2ML IJ SOLN
INTRAMUSCULAR | Status: AC
  Administered 2021-04-04: 1 mg via INTRAVENOUS
  Filled 2021-04-04: qty 2

## 2021-04-04 MED ORDER — SODIUM CHLORIDE 0.9 % IV SOLN: INTRAVENOUS | Status: DC

## 2021-04-04 MED ORDER — FAMOTIDINE 20 MG PO TABS: 20.0000 mg | ORAL_TABLET | Freq: Once | ORAL | Status: AC

## 2021-04-04 MED ORDER — CHLORHEXIDINE GLUCONATE 0.12 % MT SOLN: 15.0000 mL | Freq: Once | OROMUCOSAL | Status: AC

## 2021-04-04 MED ORDER — HYDROCHLOROTHIAZIDE 25 MG PO TABS
25.0000 mg | ORAL_TABLET | Freq: Every day | ORAL | Status: DC
  Administered 2021-04-04 – 2021-04-05 (×2): 25 mg via ORAL
  Filled 2021-04-04 (×2): qty 1

## 2021-04-04 MED ORDER — ONDANSETRON HCL 4 MG/2ML IJ SOLN
INTRAMUSCULAR | Status: DC | PRN
  Administered 2021-04-04 (×2): 4 mg via INTRAVENOUS

## 2021-04-04 MED ORDER — BUPIVACAINE LIPOSOME 1.3 % IJ SUSP
INTRAMUSCULAR | Status: AC
  Filled 2021-04-04: qty 20

## 2021-04-04 MED ORDER — OXYCODONE HCL 5 MG PO TABS
5.0000 mg | ORAL_TABLET | Freq: Once | ORAL | Status: AC | PRN
  Administered 2021-04-04: 5 mg via ORAL

## 2021-04-04 MED ORDER — CEFAZOLIN SODIUM-DEXTROSE 2-4 GM/100ML-% IV SOLN
2.0000 g | Freq: Four times a day (QID) | INTRAVENOUS | Status: AC
  Administered 2021-04-04 – 2021-04-05 (×3): 2 g via INTRAVENOUS
  Filled 2021-04-04 (×3): qty 100

## 2021-04-04 MED ORDER — ROCURONIUM BROMIDE 100 MG/10ML IV SOLN
INTRAVENOUS | Status: DC | PRN
  Administered 2021-04-04: 40 mg via INTRAVENOUS
  Administered 2021-04-04: 10 mg via INTRAVENOUS
  Administered 2021-04-04: 20 mg via INTRAVENOUS

## 2021-04-04 MED ORDER — MORPHINE SULFATE (PF) 2 MG/ML IV SOLN: 1.0000 mg | INTRAVENOUS | Status: DC | PRN

## 2021-04-04 MED ORDER — FENTANYL CITRATE (PF) 100 MCG/2ML IJ SOLN: 25.0000 ug | INTRAMUSCULAR | Status: DC | PRN

## 2021-04-04 MED ORDER — DIPHENHYDRAMINE HCL 12.5 MG/5ML PO ELIX: 12.5000 mg | ORAL_SOLUTION | ORAL | Status: DC | PRN

## 2021-04-04 MED ORDER — LACTATED RINGERS IV SOLN: INTRAVENOUS | Status: DC

## 2021-04-04 MED ORDER — GLYCOPYRROLATE 0.2 MG/ML IJ SOLN
INTRAMUSCULAR | Status: DC | PRN
Start: 2021-04-04 — End: 2021-04-04
  Administered 2021-04-04: .2 mg via INTRAVENOUS

## 2021-04-04 MED ORDER — KETOROLAC TROMETHAMINE 15 MG/ML IJ SOLN
7.5000 mg | Freq: Four times a day (QID) | INTRAMUSCULAR | Status: AC
  Administered 2021-04-04 – 2021-04-05 (×4): 7.5 mg via INTRAVENOUS
  Filled 2021-04-04 (×4): qty 1

## 2021-04-04 MED ORDER — MIDAZOLAM HCL 2 MG/2ML IJ SOLN: 1.0000 mg | INTRAMUSCULAR | Status: DC | PRN

## 2021-04-04 MED ORDER — ONDANSETRON HCL 4 MG/2ML IJ SOLN: 4.0000 mg | Freq: Once | INTRAMUSCULAR | Status: DC | PRN

## 2021-04-04 MED ORDER — BUPIVACAINE HCL (PF) 0.5 % IJ SOLN
INTRAMUSCULAR | Status: DC | PRN
  Administered 2021-04-04: 10 mL

## 2021-04-04 MED ORDER — FLEET ENEMA 7-19 GM/118ML RE ENEM: 1.0000 | ENEMA | Freq: Once | RECTAL | Status: DC | PRN

## 2021-04-04 MED ORDER — OXYCODONE HCL 5 MG PO TABS
ORAL_TABLET | ORAL | Status: AC
  Filled 2021-04-04: qty 1

## 2021-04-04 MED ORDER — BUPIVACAINE LIPOSOME 1.3 % IJ SUSP
INTRAMUSCULAR | Status: DC | PRN
  Administered 2021-04-04: 10 mL

## 2021-04-04 MED ORDER — PROPOFOL 10 MG/ML IV BOLUS
INTRAVENOUS | Status: AC
  Filled 2021-04-04: qty 20

## 2021-04-04 MED ORDER — ACETAMINOPHEN 500 MG PO TABS
500.0000 mg | ORAL_TABLET | Freq: Four times a day (QID) | ORAL | Status: AC
  Administered 2021-04-04 – 2021-04-05 (×3): 500 mg via ORAL
  Filled 2021-04-04 (×5): qty 1

## 2021-04-04 MED ORDER — BISACODYL 10 MG RE SUPP: 10.0000 mg | Freq: Every day | RECTAL | Status: DC | PRN

## 2021-04-04 MED ORDER — MIDAZOLAM HCL 2 MG/2ML IJ SOLN
INTRAMUSCULAR | Status: AC
  Filled 2021-04-04: qty 2

## 2021-04-04 MED ORDER — SODIUM CHLORIDE 0.9 % IR SOLN
Status: DC | PRN
  Administered 2021-04-04: 3000 mL

## 2021-04-04 MED ORDER — BUPIVACAINE-EPINEPHRINE (PF) 0.5% -1:200000 IJ SOLN
INTRAMUSCULAR | Status: AC
  Filled 2021-04-04: qty 30

## 2021-04-04 MED ORDER — TRANEXAMIC ACID 1000 MG/10ML IV SOLN
INTRAVENOUS | Status: AC
  Filled 2021-04-04: qty 10

## 2021-04-04 MED ORDER — KETOROLAC TROMETHAMINE 15 MG/ML IJ SOLN
INTRAMUSCULAR | Status: AC
  Administered 2021-04-04: 15 mg via INTRAVENOUS
  Filled 2021-04-04: qty 1

## 2021-04-04 MED ORDER — LIDOCAINE HCL (CARDIAC) PF 100 MG/5ML IV SOSY
PREFILLED_SYRINGE | INTRAVENOUS | Status: DC | PRN
  Administered 2021-04-04: 100 mg via INTRAVENOUS

## 2021-04-04 MED ORDER — METOCLOPRAMIDE HCL 10 MG PO TABS: 5.0000 mg | ORAL_TABLET | Freq: Three times a day (TID) | ORAL | Status: DC | PRN

## 2021-04-04 MED ORDER — FAMOTIDINE 20 MG PO TABS
ORAL_TABLET | ORAL | Status: AC
  Administered 2021-04-04: 20 mg via ORAL
  Filled 2021-04-04: qty 1

## 2021-04-04 MED ORDER — FENTANYL CITRATE (PF) 100 MCG/2ML IJ SOLN
INTRAMUSCULAR | Status: DC | PRN
  Administered 2021-04-04 (×2): 25 ug via INTRAVENOUS
  Administered 2021-04-04 (×2): 50 ug via INTRAVENOUS

## 2021-04-04 MED ORDER — 0.9 % SODIUM CHLORIDE (POUR BTL) OPTIME
TOPICAL | Status: DC | PRN
  Administered 2021-04-04: 200 mL

## 2021-04-04 MED ORDER — CHLORHEXIDINE GLUCONATE 0.12 % MT SOLN
OROMUCOSAL | Status: AC
  Administered 2021-04-04: 15 mL via OROMUCOSAL
  Filled 2021-04-04: qty 15

## 2021-04-04 MED ORDER — SODIUM CHLORIDE 0.9 % IV SOLN
INTRAVENOUS | Status: DC | PRN
  Administered 2021-04-04: 20 mL

## 2021-04-04 MED ORDER — BUPIVACAINE-EPINEPHRINE (PF) 0.5% -1:200000 IJ SOLN
INTRAMUSCULAR | Status: DC | PRN
  Administered 2021-04-04: 30 mL

## 2021-04-04 MED ORDER — SODIUM CHLORIDE FLUSH 0.9 % IV SOLN
INTRAVENOUS | Status: AC
  Filled 2021-04-04: qty 20

## 2021-04-04 MED ORDER — ACETAMINOPHEN 325 MG PO TABS: 325.0000 mg | ORAL_TABLET | Freq: Four times a day (QID) | ORAL | Status: DC | PRN

## 2021-04-04 MED ORDER — KETOROLAC TROMETHAMINE 15 MG/ML IJ SOLN: 15.0000 mg | Freq: Once | INTRAMUSCULAR | Status: AC

## 2021-04-04 MED ORDER — LOSARTAN POTASSIUM-HCTZ 100-25 MG PO TABS: 1.0000 | ORAL_TABLET | Freq: Every day | ORAL | Status: DC

## 2021-04-04 MED ORDER — ONDANSETRON HCL 4 MG/2ML IJ SOLN: 4.0000 mg | Freq: Four times a day (QID) | INTRAMUSCULAR | Status: DC | PRN

## 2021-04-04 MED ORDER — ENOXAPARIN SODIUM 40 MG/0.4ML IJ SOSY
40.0000 mg | PREFILLED_SYRINGE | INTRAMUSCULAR | Status: DC
  Administered 2021-04-05: 40 mg via SUBCUTANEOUS
  Filled 2021-04-04: qty 0.4

## 2021-04-04 MED ORDER — PROPOFOL 10 MG/ML IV BOLUS
INTRAVENOUS | Status: DC | PRN
  Administered 2021-04-04: 150 mg via INTRAVENOUS

## 2021-04-04 MED ORDER — ACETAMINOPHEN 10 MG/ML IV SOLN: 1000.0000 mg | Freq: Once | INTRAVENOUS | Status: DC | PRN

## 2021-04-04 MED ORDER — SEVOFLURANE IN SOLN
RESPIRATORY_TRACT | Status: AC
  Filled 2021-04-04: qty 250

## 2021-04-04 SURGICAL SUPPLY — 69 items
BASEPLATE GLENOSPHERE 25 (Plate) ×2 IMPLANT
BEARING HUMERAL SHLDER 36M STD (Shoulder) ×1 IMPLANT
BIT DRILL TWIST 2.7 (BIT) ×2 IMPLANT
BLADE SAW SAG 25X90X1.19 (BLADE) ×2 IMPLANT
BNDG COHESIVE 4X5 TAN ST LF (GAUZE/BANDAGES/DRESSINGS) ×2 IMPLANT
CHLORAPREP W/TINT 26 (MISCELLANEOUS) ×2 IMPLANT
COOLER POLAR GLACIER W/PUMP (MISCELLANEOUS) ×2 IMPLANT
COVER BACK TABLE REUSABLE LG (DRAPES) ×2 IMPLANT
DRAPE 3/4 80X56 (DRAPES) ×4 IMPLANT
DRAPE IMP U-DRAPE 54X76 (DRAPES) ×4 IMPLANT
DRAPE INCISE IOBAN 66X45 STRL (DRAPES) ×4 IMPLANT
DRSG OPSITE POSTOP 4X8 (GAUZE/BANDAGES/DRESSINGS) ×2 IMPLANT
ELECT BLADE 6.5 EXT (BLADE) IMPLANT
ELECT CAUTERY BLADE 6.4 (BLADE) ×2 IMPLANT
ELECT REM PT RETURN 9FT ADLT (ELECTROSURGICAL) ×2
ELECTRODE REM PT RTRN 9FT ADLT (ELECTROSURGICAL) ×1 IMPLANT
GAUZE XEROFORM 1X8 LF (GAUZE/BANDAGES/DRESSINGS) ×2 IMPLANT
GLENOID SPHERE STD STRL 36MM (Orthopedic Implant) ×2 IMPLANT
GLOVE SRG 8 PF TXTR STRL LF DI (GLOVE) ×2 IMPLANT
GLOVE SURG ENC MOIS LTX SZ7.5 (GLOVE) ×8 IMPLANT
GLOVE SURG ENC MOIS LTX SZ8 (GLOVE) ×8 IMPLANT
GLOVE SURG UNDER LTX SZ8 (GLOVE) ×2 IMPLANT
GLOVE SURG UNDER POLY LF SZ8 (GLOVE) ×2
GOWN STRL REUS W/ TWL LRG LVL3 (GOWN DISPOSABLE) ×1 IMPLANT
GOWN STRL REUS W/ TWL XL LVL3 (GOWN DISPOSABLE) ×1 IMPLANT
GOWN STRL REUS W/TWL LRG LVL3 (GOWN DISPOSABLE) ×1
GOWN STRL REUS W/TWL XL LVL3 (GOWN DISPOSABLE) ×1
HOOD PEEL AWAY FLYTE STAYCOOL (MISCELLANEOUS) ×8 IMPLANT
ILLUMINATOR WAVEGUIDE N/F (MISCELLANEOUS) IMPLANT
IV NS IRRIG 3000ML ARTHROMATIC (IV SOLUTION) ×2 IMPLANT
KIT STABILIZATION SHOULDER (MISCELLANEOUS) ×2 IMPLANT
KIT TURNOVER KIT A (KITS) ×2 IMPLANT
MANIFOLD NEPTUNE II (INSTRUMENTS) ×2 IMPLANT
MASK FACE SPIDER DISP (MASK) ×2 IMPLANT
MAT ABSORB  FLUID 56X50 GRAY (MISCELLANEOUS) ×1
MAT ABSORB FLUID 56X50 GRAY (MISCELLANEOUS) ×1 IMPLANT
NDL SAFETY ECLIPSE 18X1.5 (NEEDLE) ×1 IMPLANT
NEEDLE HYPO 18GX1.5 SHARP (NEEDLE) ×1
NEEDLE HYPO 22GX1.5 SAFETY (NEEDLE) ×2 IMPLANT
NEEDLE SPNL 20GX3.5 QUINCKE YW (NEEDLE) ×2 IMPLANT
NS IRRIG 500ML POUR BTL (IV SOLUTION) ×2 IMPLANT
PACK ARTHROSCOPY SHOULDER (MISCELLANEOUS) ×2 IMPLANT
PAD ARMBOARD 7.5X6 YLW CONV (MISCELLANEOUS) ×2 IMPLANT
PAD WRAPON POLAR SHDR UNIV (MISCELLANEOUS) ×1 IMPLANT
PENCIL SMOKE EVACUATOR (MISCELLANEOUS) ×2 IMPLANT
PIN THREADED REVERSE (PIN) ×2 IMPLANT
PULSAVAC PLUS IRRIG FAN TIP (DISPOSABLE) ×2
SCREW BONE LOCKING 4.75X30X3.5 (Screw) ×4 IMPLANT
SCREW CENTRAL 6.5X40 (Screw) ×2 IMPLANT
SCREW LOCKING 4.75MMX15MM (Screw) ×2 IMPLANT
SCREW NON-LOCK 4.75MMX15MM (Screw) ×2 IMPLANT
SHOULDER HUMERAL BEAR 36M STD (Shoulder) ×2 IMPLANT
SLING ULTRA II LG (MISCELLANEOUS) ×2 IMPLANT
SLING ULTRA II M (MISCELLANEOUS) IMPLANT
SPONGE T-LAP 18X18 ~~LOC~~+RFID (SPONGE) ×4 IMPLANT
STAPLER SKIN PROX 35W (STAPLE) ×2 IMPLANT
STEM HUMERAL STRL 11MMX55MM (Stem) ×2 IMPLANT
SUT ETHIBOND 0 MO6 C/R (SUTURE) ×2 IMPLANT
SUT FIBERWIRE #2 38 BLUE 1/2 (SUTURE) ×8
SUT VIC AB 0 CT1 36 (SUTURE) ×2 IMPLANT
SUT VIC AB 2-0 CT1 27 (SUTURE) ×2
SUT VIC AB 2-0 CT1 TAPERPNT 27 (SUTURE) ×2 IMPLANT
SUTURE FIBERWR #2 38 BLUE 1/2 (SUTURE) ×4 IMPLANT
SYR 10ML LL (SYRINGE) ×2 IMPLANT
SYR 30ML LL (SYRINGE) ×2 IMPLANT
TIP FAN IRRIG PULSAVAC PLUS (DISPOSABLE) ×1 IMPLANT
TOWEL OR 17X26 4PK STRL BLUE (TOWEL DISPOSABLE) ×2 IMPLANT
TRAY HUM MINI SHOULDER +0 40D (Shoulder) ×2 IMPLANT
WRAPON POLAR PAD SHDR UNIV (MISCELLANEOUS) ×2

## 2021-04-04 NOTE — Op Note (Signed)
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  Post-Op Diagnosis:   Same  Procedure:   Reverse right total shoulder arthroplasty.  Surgeon:   Maryagnes Amos, MD  Assistant:   Horris Latino, PA-C; Amedeo Plenty, PA-S  Anesthesia:   General endotracheal with an interscalene block using Exparel placed preoperatively by the anesthesiologist.  Findings:   As above.  Complications:   None  EBL:    150 cc  Fluids:   800 cc crystalloid  UOP:   None  TT:   None  Drains:   None  Closure:   Staples  Implants:   All press-fit Bi[employer] Comprehensive system with a #11 micro-humeral stem, a 40 mm humeral tray with a standard insert, and a mini-base plate with a 36 mm glenosphere.  Brief Clinical Note:   The patient is a 65 year old female with a history of right shoulder pain and weakness resulting from an acute dislocation event which occurred 2.5 to 3 months ago. Her symptoms have persisted despite medications, activity modification, etc. Her history and examination are consistent with a massive irreparable rotator cuff tear with early cuff arthropathy. The patient presents at this time for a reverse right total shoulder arthroplasty.  Procedure:   The patient underwent placement of an interscalene block using Exparel by the anesthesiologist in the preoperative holding area before being brought into the operating room and lain in the supine position. The patient then underwent general endotracheal intubation and anesthesia before the patient was repositioned in the beach chair position using the beach chair positioner. The right shoulder and upper extremity were prepped with ChloraPrep solution before being draped sterilely. Preoperative antibiotics were administered. A timeout was performed to verify the appropriate surgical site.    A  standard anterior approach to the shoulder was made through an approximately 4-5 inch incision. The incision was carried down through the subcutaneous tissues to expose the deltopectoral fascia. The interval between the deltoid and pectoralis muscles was identified and this plane developed, retracting the cephalic vein laterally with the deltoid muscle. The conjoined tendon was identified. Its lateral margin was dissected and the Kolbel self-retraining retractor inserted. The "three sisters" were identified and cauterized. Bursal tissues were removed to improve visualization. The subscapularis tendon was released from its attachment to the lesser tuberosity 1 cm proximal to its insertion and several tagging sutures placed. The inferior capsule was released with care after identifying and protecting the axillary nerve. The proximal humeral cut was made at approximately 25 of retroversion using the extra-medullary guide.   Attention was redirected to the glenoid. The labrum was debrided circumferentially before the center of the glenoid was marked with electrocautery. The guidewire was drilled into the glenoid neck using the appropriate guide. After verifying its position, it was overreamed with the mini-baseplate reamer to create a flat surface. The permanent mini-baseplate was impacted into place. It was stabilized with a 40 x 6.5 mm central screw and four peripheral screws. Locking screws were placed superiorly and inferiorly while nonlocking screws were placed anteriorly and posteriorly. The permanent 36 mm glenosphere was then impacted into place and its Morse taper locking mechanism verified using manual distraction.  Attention was directed to the humeral side. The humeral canal was reamed sequentially beginning with the end-cutting reamer then progressing from a 4 mm reamer up to a 11 mm reamer. This provided  excellent circumferential chatter. The canal was broached beginning with a #9 broach and  progressing to a #11 broach. This was left in place and a trial reduction performed using the standard trial humeral platform. The arm demonstrated excellent range of motion as the hand could be brought across the chest to the opposite shoulder and brought to the top of the patient's head and to the patient's ear. The shoulder appeared stable throughout this range of motion. The joint was dislocated and the trial components removed. The permanent #11 micro-stem was impacted into place with care taken to maintain the appropriate version. The permanent 40 mm humeral platform with the standard insert was put together on the back table and impacted into place. Again, the [facility] taper locking mechanism was verified using manual distraction. The shoulder was relocated using two finger pressure and again placed through a range of motion with the findings as described above.  The wound was copiously irrigated with sterile saline solution using the jet lavage system before a total of 10 cc of Exparel diluted out to 50 cc with normal saline and 30 cc of 0.5% Sensorcaine with epinephrine was injected into the pericapsular and peri-incisional tissues to help with postoperative analgesia. The subscapularis tendon was reapproximated using #2 FiberWire interrupted sutures. The deltopectoral interval was closed using #0 Vicryl interrupted sutures before the subcutaneous tissues were closed using 2-0 Vicryl interrupted sutures. The skin was closed using staples. Prior to closing the skin, 1 g of transexemic acid in 10 cc of normal saline was injected intra-articularly to help with postoperative bleeding. A sterile occlusive dressing was applied to the wound before the arm was placed into a shoulder immobilizer with an abduction pillow. A Polar Care system also was applied to the shoulder. The patient was then transferred back to a hospital bed before being awakened, extubated, and returned to the recovery room in satisfactory  condition after tolerating the procedure well.

## 2021-04-04 NOTE — Anesthesia Procedure Notes (Signed)
Anesthesia Regional Block: Interscalene brachial plexus block   Pre-Anesthetic Checklist: , timeout performed,  Correct Patient, Correct Site, Correct Laterality,  Correct Procedure, Correct Position, site marked,  Risks and benefits discussed,  Surgical consent,  Pre-op evaluation,  At surgeon's request and post-op pain management  Laterality: Right  Prep: chloraprep       Needles:  Injection technique: Single-shot  Needle Type: Echogenic Needle     Needle Length: 4cm  Needle Gauge: 25     Additional Needles:   Narrative:  Start time: 04/04/2021 7:22 AM End time: 04/04/2021 7:23 AM Injection made incrementally with aspirations every 5 mL.  Performed by: Personally  Anesthesiologist: Corinda Gubler, MD  Additional Notes: Patient's chart reviewed and they were deemed appropriate candidate for procedure, at surgeon's request. Patient educated about risks, benefits, and alternatives of the block including but not limited to: temporary or permanent nerve damage, bleeding, infection, damage to surround tissues, pneumothorax, hemidiaphragmatic paralysis, unilateral Horner's syndrome, block failure, local anesthetic toxicity. Patient expressed understanding. A formal time-out was conducted consistent with institution rules.  Monitors were applied, and minimal sedation used (see nursing record). The site was prepped with skin prep and allowed to dry, and sterile gloves were used. A high frequency linear ultrasound probe with probe cover was utilized throughout. C5-7 nerve roots located and appeared anatomically normal, local anesthetic injected around them, and echogenic block needle trajectory was monitored throughout. Aspiration performed every 44ml. Lung and blood vessels were avoided. All injections were performed without resistance and free of blood and paresthesias. The patient tolerated the procedure well.  Injectate: 22ml exparel + 10ml 0.5% bupivacaine

## 2021-04-04 NOTE — H&P (Signed)
History of Present Illness:  Brandi Wiggins is a 65 y.o. female who presents today for surgical history and physical for upcoming right reverse total shoulder arthroplasty. Surgery scheduled with Dr. Joice Lofts on 04/04/2021. The patient reports continued moderate discomfort in the right shoulder. She reports her pain is an 8 out of 10, she denies any recent trauma or injury affecting the right shoulder since she was last evaluated. She does report occasional tingling in the fingers of her right hand but states that this is not constant. She denies any personal history of heart attack, stroke, asthma or COPD. No personal history of blood clots.  Past Medical History:  History of head injury  She had a head injury as a 5-year-old that sounds like a subdural hematoma with burr holes to relieve the pressure; no long-term damage from this.   Hypertension   Past Surgical History:  Arthroscopic SLAP repair, arthroscopic debridement of grade 3 chondromalacial changes of the glenoid, arthroscopic subacromial decompression, and mini open repair of rotator cuff tear with mini open biceps tenodesis left shoulder Left 10/14/14   SUBDURAL HEMATOMA EVACUATION VIA CRANIOTOMY   Past Family History:  Cancer Mother   Heart disease Father   Medications:  acetaminophen (TYLENOL) 325 MG tablet Take 650 mg by mouth every 4 (four) hours as needed for Pain.   ibuprofen (ADVIL,MOTRIN) 200 MG tablet Take 800 mg by mouth every 6 (six) hours as needed for Pain.   lisinopril-hydrochlorothiazide (PRINZIDE,ZESTORETIC) 20-12.5 mg tablet Take 1 tablet by mouth once daily.   sulfamethoxazole-trimethoprim (BACTRIM DS) 800-160 mg tablet Take 1 tablet (160 mg of trimethoprim total) by mouth 2 (two) times daily for 5 days 10 tablet 0   Allergies: No Known Allergies   Review of Systems:  A comprehensive 14 point ROS was performed, reviewed by me today, and the pertinent orthopaedic findings are documented in the HPI.  Physical  Exam: BP 118/78 (BP Location: Left upper arm, Patient Position: Sitting, BP Cuff Size: Large Adult)  Ht 170.2 cm (5\' 7" )  Wt (!) 114.3 kg (252 lb)  BMI 39.47 kg/m   General/Constitutional: The patient appears to be well-nourished, well-developed, and in no acute distress. Neuro/Psych: Normal mood and affect, oriented to person, place and time. Eyes: Non-icteric. Pupils are equal, round, and reactive to light, and exhibit synchronous movement. ENT: Unremarkable. Lymphatic: No palpable adenopathy. Respiratory: Lungs clear to auscultation, Normal chest excursion, No wheezes and Non-labored breathing Cardiovascular: Regular rate and rhythm. No murmurs. and No edema, swelling or tenderness, except as noted in detailed exam. Integumentary: No impressive skin lesions present, except as noted in detailed exam. Musculoskeletal: Unremarkable, except as noted in detailed exam.  Right shoulder exam: SKIN: normal SWELLING: none WARMTH: none LYMPH NODES: no adenopathy palpable CREPITUS: none TENDERNESS: Mildly tender along anterolateral acromion ROM (active):  Forward flexion: 60 degrees Abduction: 45 degrees Internal rotation: T11 ROM (passive):  Forward flexion: 150 degrees Abduction: 140 degrees  ER/IR at 90 abd: 80 degrees / 60 degrees   She has moderate pain with attempted forward flexion and abduction, and mild pain at the extremes of all passive motions.   STRENGTH: Forward flexion: 2+-3/5 Abduction: 2+-3/5 External rotation: 2/5 Internal rotation: 4/5 Pain with RC testing: Moderate pain with attempted resisted forward flexion and abduction, and mild pain with resisted external rotation   STABILITY: Normal   SPECIAL TESTS: ' test: positive, mild-moderate Speed's test: Not evaluated Capsulitis - pain w/ passive ER: no Crossed arm test: Minimally positive Crank: Not evaluated Anterior  apprehension: Negative Posterior apprehension: Not evaluated   She is  neurovascularly intact to the right upper extremity.  Right Shoulder Imaging, MRI: MRI Shoulder Cartilage: No cartilage abnormality. MRI Shoulder Rotator Cuff: Full-thickness tears of supraspinatus and infraspinatus tendons. Retracted to the glenohumeral joint. Moderate to severe tendinopathy of subscapularis tendon. Moderate to severe atrophy of supraspinatus and infraspinatus muscles. MRI Shoulder Labrum / Biceps: Biceps tendinopathy. Degenerative labral tears. MRI Shoulder Bone: Normal bone.  Impression: 1. Traumatic complete tear of right rotator cuff. 2. Status post anterior dislocation of right shoulder.  Plan:  1. Treatment options were discussed today with the patient. 2. Patient is scheduled for a right reverse total shoulder arthroplasty with Dr. Joice Lofts on 04/04/2021. 3. The patient was instructed on the risk and benefits of surgery and wishes to proceed at this time. 4. This document will serve as a surgical history and physical for the patient. 5. The patient will follow-up per standard postop protocol. They can call the clinic they have any questions, new symptoms develop or symptoms worsen.  The procedure was discussed with the patient, as were the potential risks (including bleeding, infection, nerve and/or blood vessel injury, persistent or recurrent pain, failure of the hardware, dislocation, axillary nerve injury, need for further surgery, blood clots, strokes, heart attacks and/or arhythmias, pneumonia, etc.) and benefits. The patient states her understanding and wishes to proceed.    H&P reviewed and patient re-examined. No changes.

## 2021-04-04 NOTE — Evaluation (Signed)
Occupational Therapy Evaluation Patient Details Name: Nalina Yeatman MRN: [id number] DOB: 1956-08-03 Today's Date: 04/04/2021    History of Present Illness admitted for acute hospitalization s/p R reverse TSA (04/04/21)   Clinical Impression   Ms Reddick was seen for OT evaluation this date. Prior to hospital admission, pt was Independent for mobility and I/ADLs. Pt lives alone with plans to d/c to her daughters single level home, dtr available 24/7. Pt presents to acute OT demonstrating impaired ADL performance and functional mobility 2/2 decreased activity tolerance, functional strength/ROM/balance deficits, and impaired functional use of dominant RUE. Pt currently requires MAX A don/doff R sling immobilizer and polar care. MOD A don/doff gown. MIN A + HHA for toielt t/f, SBA perihygiene c lateral leans. Pt would benefit from skilled OT to address noted impairments and functional limitations (see below for any additional details) in order to maximize safety and independence while minimizing falls risk and caregiver burden. Upon hospital discharge, recommend following surgeons recommendation for d/c planning.     Follow Up Recommendations  Follow surgeon's recommendation for DC plan and follow-up therapies    Equipment Recommendations  None recommended by OT    Recommendations for Other Services       Precautions / Restrictions Precautions Precautions: Fall Required Braces or Orthoses: Sling Restrictions Weight Bearing Restrictions: Yes RUE Weight Bearing: Non weight bearing      Mobility Bed Mobility Overal bed mobility: Needs Assistance Bed Mobility: Sit to Supine     Supine to sit: Supervision Sit to supine: Supervision   General bed mobility comments: heavy use of bedrails, exit towards L side of bed    Transfers Overall transfer level: Needs assistance Equipment used: 1 person hand held assist Transfers: Sit to/from Stand Sit to Stand: Min assist          General transfer comment: broad BOS, increased use of trunk flexion/momentum to initiate lift off    Balance Overall balance assessment: Needs assistance Sitting-balance support: No upper extremity supported;Feet supported Sitting balance-Leahy Scale: Good     Standing balance support: Single extremity supported Standing balance-Leahy Scale: Fair                             ADL either performed or assessed with clinical judgement   ADL Overall ADL's : Needs assistance/impaired                                       General ADL Comments: MAX A don/doff R sling immobilizer and polar care. MOD A don/doff gown. MIN A + HHA for toielt t/f, SBA perihygiene c lateral leans                  Pertinent Vitals/Pain Pain Assessment: No/denies pain     Hand Dominance Right   Extremity/Trunk Assessment Upper Extremity Assessment Upper Extremity Assessment: RUE deficits/detail;LUE deficits/detail RUE: Unable to fully assess due to immobilization LUE Deficits / Details: WFL   Lower Extremity Assessment Lower Extremity Assessment: Overall WFL for tasks assessed       Communication Communication Communication: No difficulties   Cognition Arousal/Alertness: Awake/alert Behavior During Therapy: WFL for tasks assessed/performed Overall Cognitive Status: Within Functional Limits for tasks assessed  Exercises Exercises: Other exercises Other Exercises Other Exercises: Pt educated re: OT role, DME recs, d/c recs, falls prevention NWBing pcs, adapted dressing/bathing techniques Other Exercises: LBD, UBD, don/doff sling and polar care, toileting, bathing   Shoulder Instructions      Home Living Family/patient expects to be discharged to:: Private residence Living Arrangements: Alone Available Help at Discharge: Family;Available 24 hours/day (daughter) Type of Home: House Home Access: Level entry      Home Layout: One level         Bathroom Toilet: Handicapped height         Additional Comments: Planning to discharge to daughter''s home-single level with threshold step to enter/exit; daughter will be able to provide 24/7 supervision and assist as needed      Prior Functioning/Environment Level of Independence: Independent        Comments: Indep with ADLs, household and community mobilization without assist device; denies fall history outside of mechanism of injury for shoulder injury/repair (fell while playing basketball with grandson). 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        OT Problem List: Decreased range of motion;Decreased activity tolerance;Impaired balance (sitting and/or standing);Decreased knowledge of precautions;Impaired UE functional use      OT Treatment/Interventions: Self-care/ADL training;Therapeutic exercise;Energy conservation;DME and/or AE instruction;Patient/family education;Balance training    OT Goals(Current goals can be found in the care plan section) Acute Rehab OT Goals Patient Stated Goal: to return home OT Goal Formulation: With patient Time For Goal Achievement: 04/18/21 Potential to Achieve Goals: Good ADL Goals Pt Will Perform Upper Body Dressing: with min assist;with caregiver independent in assisting;sitting Pt Will Transfer to Toilet: Independently;ambulating;regular height toilet Pt Will Perform Toileting - Clothing Manipulation and hygiene: Independently;sitting/lateral leans  OT Frequency: Min 2X/week    AM-PAC OT "6 Clicks" Daily Activity     Outcome Measure Help from another person eating meals?: A Little Help from another person taking care of personal grooming?: A Little Help from another person toileting, which includes using toliet, bedpan, or urinal?: A Little Help from another person bathing (including washing, rinsing, drying)?: A Lot Help from another person to put on and taking off regular upper  body clothing?: A Lot Help from another person to put on and taking off regular lower body clothing?: A Little 6 Click Score: 16   End of Session    Activity Tolerance: Patient tolerated treatment well Patient left: in bed;with call bell/phone within reach;with bed alarm set  OT Visit Diagnosis: Unsteadiness on feet (R26.81)                Time: 6503-5465 OT Time Calculation (min): 32 min Charges:  OT General Charges $OT Visit: 1 Visit OT Evaluation $OT Eval Moderate Complexity: 1 Mod OT Treatments $Self Care/Home Management : 23-37 mins  Kathie Dike, M.S. OTR/L  04/04/21, 3:50 PM  ascom [phone]

## 2021-04-04 NOTE — Transfer of Care (Signed)
Immediate Anesthesia Transfer of Care Note  Patient: Brandi Wiggins  Procedure(s) Performed: REVERSE SHOULDER ARTHROPLASTY (Right: Shoulder)  Patient Location: PACU  Anesthesia Type:General and Regional  Level of Consciousness: awake, alert  and oriented  Airway & Oxygen Therapy: Patient Spontanous Breathing and Patient connected to face mask oxygen  Post-op Assessment: Report given to RN and Post -op Vital signs reviewed and stable  Post vital signs: Reviewed and stable  Last Vitals:  Vitals Value Taken Time  BP 109/87 04/04/21 1030  Temp    Pulse 81 04/04/21 1032  Resp 15 04/04/21 1032  SpO2 99 % 04/04/21 1032  Vitals shown include unvalidated device data.  Last Pain:  Vitals:   04/04/21 0617  TempSrc: Oral  PainSc: 6          Complications: No notable events documented.

## 2021-04-04 NOTE — Anesthesia Procedure Notes (Signed)
Procedure Name: Intubation Date/Time: 04/04/2021 8:00 AM Performed by: Nelda Marseille, CRNA Pre-anesthesia Checklist: Patient identified, Patient being monitored, Timeout performed, Emergency Drugs available and Suction available Patient Re-evaluated:Patient Re-evaluated prior to induction Oxygen Delivery Method: Circle system utilized Preoxygenation: Pre-oxygenation with 100% oxygen Induction Type: IV induction Ventilation: Mask ventilation without difficulty Laryngoscope Size: Mac, 3 and McGraph Grade View: Grade I Tube type: Oral Tube size: 7.0 mm Number of attempts: 1 Airway Equipment and Method: Stylet Placement Confirmation: ETT inserted through vocal cords under direct vision, positive ETCO2 and breath sounds checked- equal and bilateral Secured at: 23 cm Tube secured with: Tape Dental Injury: Teeth and Oropharynx as per pre-operative assessment

## 2021-04-04 NOTE — Anesthesia Postprocedure Evaluation (Signed)
Anesthesia Post Note  Patient: Brandi Wiggins  Procedure(s) Performed: REVERSE SHOULDER ARTHROPLASTY (Right: Shoulder)  Patient location during evaluation: PACU Anesthesia Type: General Level of consciousness: awake and alert Pain management: pain level controlled Vital Signs Assessment: post-procedure vital signs reviewed and stable Respiratory status: spontaneous breathing, nonlabored ventilation, respiratory function stable and patient connected to nasal cannula oxygen Cardiovascular status: blood pressure returned to baseline and stable Postop Assessment: no apparent nausea or vomiting Anesthetic complications: no   No notable events documented.   Last Vitals:  Vitals:   04/04/21 1115 04/04/21 1136  BP: 116/65 125/81  Pulse: 85 88  Resp: 15 15  Temp:  36.9 C  SpO2: 98% 98%    Last Pain:  Vitals:   04/04/21 1112  TempSrc:   PainSc: 5                  Corinda Gubler

## 2021-04-04 NOTE — Anesthesia Preprocedure Evaluation (Signed)
Anesthesia Evaluation  Patient identified by MRN, date of birth, ID band Patient awake    Reviewed: Allergy & Precautions, NPO status , Patient's Chart, lab work & pertinent test results  History of Anesthesia Complications Negative for: history of anesthetic complications  Airway Mallampati: II  TM Distance: >3 FB Neck ROM: Full    Dental  (+) Partial Lower, Partial Upper   Pulmonary neg sleep apnea, neg COPD, Current SmokerPatient did not abstain from smoking.,    Pulmonary exam normal breath sounds clear to auscultation       Cardiovascular Exercise Tolerance: Good METShypertension, Pt. on medications (-) CAD and (-) Past MI (-) dysrhythmias  Rhythm:Regular Rate:Normal - Systolic murmurs    Neuro/Psych negative neurological ROS  negative psych ROS   GI/Hepatic neg GERD  ,(+)     (-) substance abuse  ,   Endo/Other  neg diabetes  Renal/GU negative Renal ROS     Musculoskeletal   Abdominal   Peds  Hematology   Anesthesia Other Findings Past Medical History: No date: Hypertension  Reproductive/Obstetrics                             Anesthesia Physical Anesthesia Plan  ASA: 2  Anesthesia Plan: General   Post-op Pain Management:  Regional for Post-op pain   Induction: Intravenous  PONV Risk Score and Plan: 2 and Ondansetron, Dexamethasone and Midazolam  Airway Management Planned: Oral ETT  Additional Equipment: None  Intra-op Plan:   Post-operative Plan: Extubation in OR  Informed Consent: I have reviewed the patients History and Physical, chart, labs and discussed the procedure including the risks, benefits and alternatives for the proposed anesthesia with the patient or authorized representative who has indicated his/her understanding and acceptance.     Dental advisory given  Plan Discussed with: CRNA and Surgeon  Anesthesia Plan Comments: (Discussed risks of  anesthesia with patient, including PONV, sore throat, lip/dental damage. Rare risks discussed as well, such as cardiorespiratory and neurological sequelae, and allergic reactions. Patient understands. Discussed r/b/a of interscalene block, including elective nature. Risks discussed: - Rare: bleeding, infection, nerve damage - shortness of breath from hemidiaphragmatic paralysis - unilateral horner's syndrome - poor/non-working blocks Patient understands and agrees. )        Anesthesia Quick Evaluation

## 2021-04-04 NOTE — Evaluation (Signed)
Physical Therapy Evaluation Patient Details Name: Brandi Wiggins MRN: [id number] DOB: 1956/04/18 Today's Date: 04/04/2021   History of Present Illness  admitted for acute hospitalization s/p R reverse TSA (04/04/21)  Clinical Impression  Patient resting in bed upon arrival to session; easily awakens to voice.  Oriented to basic information, follows commands and eager for OOB with therapist (voicing need to empty bladder).  Denies pain in R UE at this time; maintained in immobilizer/sling throughout session.  Demonstrates trace movement in R hand, digits 3-5; resolving sensory awareness mid-forearm distally.  Able to complete bed mobility with supervision; sit/stand, basic transfers and gait (60' x2) with L HHA, min assist.  Demonstrates reciprocal stepping pattern with fair step height/length; mild sway bilat, but no overt buckling or LOB.  Min cuing for awareness of R UE in doorways and when negotiating obstacles. Mild reports of dizziness with initial gait trial, resolved with seated rest period (BP 114/52, HR 96).  Will plan to trial [facility] next session if balance deficits/sway persist. Would benefit from skilled PT to address above deficits and promote optimal return to PLOF.; recommend discharge to home with follow up therapy as determined by physician.    Follow Up Recommendations Follow surgeon's recommendation for DC plan and follow-up therapies (per patient, scheduled for outpatient PT 8/19)    Equipment Recommendations  Cane    Recommendations for Other Services       Precautions / Restrictions Precautions Precautions: Fall Required Braces or Orthoses: Sling Restrictions Weight Bearing Restrictions: Yes RUE Weight Bearing: Non weight bearing      Mobility  Bed Mobility Overal bed mobility: Needs Assistance Bed Mobility: Supine to Sit     Supine to sit: Supervision     General bed mobility comments: heavy use of bedrails, exit towards L side of bed     Transfers Overall transfer level: Needs assistance Equipment used: None Transfers: Sit to/from Stand Sit to Stand: Min assist         General transfer comment: broad BOS, increased use of trunk flexion/momentum to initiate lift off  Ambulation/Gait Ambulation/Gait assistance: Min assist Gait Distance (Feet):  (60' x2) Assistive device: 1 person hand held assist       General Gait Details: reciprocal stepping pattern with fair step height/length; mild sway bilat, but no overt buckling or LOB.  Min cuing for awareness of R UE in doorways and when negotiating obstacles. Mild reports of dizziness with initial gait trial, resolved with seated rest period (BP 114/52, HR 96)  Stairs            Wheelchair Mobility    Modified Rankin (Stroke Patients Only)       Balance Overall balance assessment: Needs assistance Sitting-balance support: No upper extremity supported;Feet supported Sitting balance-Leahy Scale: Good     Standing balance support: Single extremity supported Standing balance-Leahy Scale: Fair                               Pertinent Vitals/Pain Pain Assessment: No/denies pain    Home Living Family/patient expects to be discharged to:: Private residence Living Arrangements: Alone               Additional Comments: Planning to discharge to daughter''s home-single level with threshold step to enter/exit; daughter will be able to provide 24/7 supervision and assist as needed    Prior Function Level of Independence: Independent         Comments:  Indep with ADLs, household and community mobilization without assist device; denies fall history outside of mechanism of injury for shoulder injury/repair (fell while playing basketball with grandson). 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     Hand Dominance   Dominant Hand: Right    Extremity/Trunk Assessment   Upper Extremity Assessment Upper Extremity Assessment:  (R UE  immobilized in abduct sling throughout session; trace movement in digits 3-5, resolving paresthesia mid-forearm distally.  L UE grossly WFL)    Lower Extremity Assessment Lower Extremity Assessment:  (grossly 4 to 4+/5 throughout)       Communication   Communication: No difficulties  Cognition Arousal/Alertness: Awake/alert Behavior During Therapy: WFL for tasks assessed/performed Overall Cognitive Status: Within Functional Limits for tasks assessed                                        General Comments      Exercises Other Exercises Other Exercises: Toilet transfer, ambulatory with L HHA, min assist; sit/stand from standard toilet with L HHA, min assist.   Assessment/Plan    PT Assessment Patient needs continued PT services  PT Problem List Decreased strength;Decreased range of motion;Decreased activity tolerance;Decreased balance;Decreased mobility;Decreased knowledge of use of DME;Decreased safety awareness;Decreased knowledge of precautions;Cardiopulmonary status limiting activity       PT Treatment Interventions DME instruction;Gait training;Stair training;Functional mobility training;Therapeutic activities;Therapeutic exercise;Balance training;Patient/family education    PT Goals (Current goals can be found in the Care Plan section)  Acute Rehab PT Goals Patient Stated Goal: to return home PT Goal Formulation: With patient Time For Goal Achievement: 04/18/21 Potential to Achieve Goals: Good    Frequency 7X/week   Barriers to discharge        Co-evaluation               AM-PAC PT "6 Clicks" Mobility  Outcome Measure Help needed turning from your back to your side while in a flat bed without using bedrails?: None Help needed moving from lying on your back to sitting on the side of a flat bed without using bedrails?: None Help needed moving to and from a bed to a chair (including a wheelchair)?: A Little Help needed standing up from a  chair using your arms (e.g., wheelchair or bedside chair)?: A Little Help needed to walk in hospital room?: A Little Help needed climbing 3-5 steps with a railing? : A Little 6 Click Score: 20    End of Session   Activity Tolerance: Patient tolerated treatment well Patient left: in bed;with call bell/phone within reach (seated edge of bed, OT at bedside for evaluation)   PT Visit Diagnosis: Other abnormalities of gait and mobility (R26.89)    Time: 2876-8115 PT Time Calculation (min) (ACUTE ONLY): 18 min   Charges:   PT Evaluation $PT Eval Moderate Complexity: 1 Mod PT Treatments $Therapeutic Activity: 8-22 mins      Blakely Gluth H. Manson Passey, PT, DPT, NCS 04/04/21, 3:18 PM [phone]

## 2021-04-05 LAB — CBC
HCT: 35.5 % — ABNORMAL LOW (ref 36.0–46.0)
Hemoglobin: 12.2 g/dL (ref 12.0–15.0)
MCH: 30.7 pg (ref 26.0–34.0)
MCHC: 34.4 g/dL (ref 30.0–36.0)
MCV: 89.2 fL (ref 80.0–100.0)
Platelets: 349 10*3/uL (ref 150–400)
RBC: 3.98 MIL/uL (ref 3.87–5.11)
RDW: 12.7 % (ref 11.5–15.5)
WBC: 11.3 10*3/uL — ABNORMAL HIGH (ref 4.0–10.5)
nRBC: 0 % (ref 0.0–0.2)

## 2021-04-05 LAB — BASIC METABOLIC PANEL
Anion gap: 9 (ref 5–15)
BUN: 20 mg/dL (ref 8–23)
CO2: 22 mmol/L (ref 22–32)
Calcium: 10.2 mg/dL (ref 8.9–10.3)
Chloride: 99 mmol/L (ref 98–111)
Creatinine, Ser: 0.83 mg/dL (ref 0.44–1.00)
GFR, Estimated: >60 mL/min (ref >60)
Glucose, Bld: 111 mg/dL — ABNORMAL HIGH (ref 70–99)
Potassium: 3.8 mmol/L (ref 3.5–5.1)
Sodium: 130 mmol/L — ABNORMAL LOW (ref 135–145)

## 2021-04-05 MED ORDER — ONDANSETRON HCL 4 MG PO TABS
4.0000 mg | ORAL_TABLET | Freq: Four times a day (QID) | ORAL | 0 refills | Status: DC | PRN
Start: 2021-04-05 — End: 2024-04-14

## 2021-04-05 MED ORDER — HYDROCODONE-ACETAMINOPHEN 5-325 MG PO TABS: 1.0000 | ORAL_TABLET | Freq: Four times a day (QID) | ORAL | 0 refills | Status: DC | PRN

## 2021-04-05 MED ORDER — ASPIRIN EC 325 MG PO TBEC: 325.0000 mg | DELAYED_RELEASE_TABLET | Freq: Every day | ORAL | 0 refills | Status: DC

## 2021-04-05 NOTE — Progress Notes (Signed)
D/C paperwork with 3 hard scripts given to the patient. Copy in chart.  No unanswered questions. IV removed with tip intact.  All belongings with patient.  Sister at bedside. Sister is the patient transportation to home.

## 2021-04-05 NOTE — Discharge Instructions (Signed)
Diet: As you were doing prior to hospitalization   Shower:  May shower but keep the wounds dry, use an occlusive plastic wrap, NO SOAKING IN TUB.  If the bandage gets wet, change with a clean dry gauze.  Dressing:  You may change your dressing as needed. Change the dressing with sterile gauze dressing.    Activity:  Increase activity slowly as tolerated, but follow the weight bearing instructions below.  No lifting or driving for 6 weeks.  Can remove the sling for showers.  Can work in gentle range of motion to the elbow, wrist hand and fingers.  Weight Bearing:   Non-weightbearing to the right arm.  Blood Clot Prevention: Take 1 325mg  aspirin daily for four weeks.  To prevent constipation: you may use a stool softener such as -  Colace (over the counter) 100 mg by mouth twice a day  Drink plenty of fluids (prune juice may be helpful) and high fiber foods Miralax (over the counter) for constipation as needed.    Itching:  If you experience itching with your medications, try taking only a single pain pill, or even half a pain pill at a time.  You may take up to 10 pain pills per day, and you can also use benadryl over the counter for itching or also to help with sleep.   Precautions:  If you experience chest pain or shortness of breath - call 911 immediately for transfer to the hospital emergency department!!  If you develop a fever greater that 101 F, purulent drainage from wound, increased redness or drainage from wound, or calf pain-Call [facility]                                              Follow- Up Appointment:  Please call for an appointment to be seen in 2 weeks at [facility]

## 2021-04-05 NOTE — TOC Transition Note (Addendum)
Transition of Care [facility]) - CM/SW Discharge Note   Patient Details  Name: Brandi Wiggins MRN: [id number] Date of Birth: February 03, 1956  [facility] CM/SW Contact:  Margarito Liner, LCSW Phone Number: 04/05/2021, 12:49 PM   Clinical Narrative:  Patient has orders to discharge home today. CSW called patient in the room, introduced role, and explained that PT recommendations would be discussed. Patient confirmed she has an outpatient PT appointment at [facility] on 8/19. PT recommending a cane. Ordered through [employer] but do not have any here at hospital or at their store. They will ship one which will take 3-5 business days. Patient is aware and agreeable. She is going home with a family member in [place] so gave [employer] that address. No further concerns. CSW signing off.   1:07 pm: [employer] called and said patient does not have a qualifying diagnosis for a cane. Patient will pick one up at [facility].  Final next level of care: OP Rehab Barriers to Discharge: No Barriers Identified   Patient Goals and CMS Choice        Discharge Placement                Patient to be transferred to facility by: Family at bedside to take her home.   Patient and family notified of of transfer: 04/05/21  Discharge Plan and Services                DME Arranged: Gilmer Mor DME Agency: [employer] Date DME Agency Contacted: 04/05/21   Representative spoke with at DME Agency: Bjorn Loser            Social Determinants of Health (SDOH) Interventions     Readmission Risk Interventions No flowsheet data found.

## 2021-04-05 NOTE — Progress Notes (Signed)
Occupational Therapy Treatment Patient Details Name: Brandi Wiggins MRN: [id number] DOB: [birth year] Today's Date: 04/05/2021    History of present illness admitted for acute hospitalization s/p R reverse TSA (04/04/21)   OT comments  [name] was seen for OT treatment on this date. Upon arrival to room pt seated EOB, eager for discharge. Pt requires MOD A don/doff R sling immobilizer and polar care. MIN A don button up shirt. MINA don pants, no LOBs in standing, assist to thread over ankles. Pt instructed in Pt making good progress toward goals. Pt continues to benefit from skilled OT services to maximize return to PLOF and minimize risk of future falls, injury, caregiver burden, and readmission. Will continue to follow POC. Discharge recommendation remains appropriate.    Follow Up Recommendations  Follow surgeon's recommendation for DC plan and follow-up therapies    Equipment Recommendations  None recommended by OT    Recommendations for Other Services      Precautions / Restrictions Precautions Precautions: Fall Required Braces or Orthoses: Sling Restrictions Weight Bearing Restrictions: Yes RUE Weight Bearing: Non weight bearing       Mobility Bed Mobility Overal bed mobility: Modified Independent  Transfers Overall transfer level: Needs assistance Equipment used: 1 person hand held assist Transfers: Sit to/from Stand Sit to Stand: Supervision              Balance Overall balance assessment: Needs assistance Sitting-balance support: No upper extremity supported;Feet supported Sitting balance-Leahy Scale: Good     Standing balance support: Single extremity supported;During functional activity Standing balance-Leahy Scale: Fair                             ADL either performed or assessed with clinical judgement   ADL Overall ADL's : Needs assistance/impaired                                       General ADL Comments: MOD A  don/doff R sling immobilizer and polar care. MIN A don button up shirt. MINA don pants, no LOBs in standing, assist to thread over ankles      Cognition Arousal/Alertness: Awake/alert Behavior During Therapy: WFL for tasks assessed/performed Overall Cognitive Status: Within Functional Limits for tasks assessed                                 General Comments: recalls method for donning/doffing sling from prior session        Exercises Exercises: Other exercises Other Exercises Other Exercises: Pt educated re: DME recs, d/c recs, falls prevention NWBing pcs, adapted dressing/bathing techniques Other Exercises: LBD, UBD, don/doff sling and polar care,           Pertinent Vitals/ Pain       Pain Assessment: No/denies pain         Frequency  Min 2X/week        Progress Toward Goals  OT Goals(current goals can now be found in the care plan section)  Progress towards OT goals: Progressing toward goals  Acute Rehab OT Goals Patient Stated Goal: go home OT Goal Formulation: With patient Time For Goal Achievement: 04/18/21 Potential to Achieve Goals: Good ADL Goals Pt Will Perform Upper Body Dressing: with min assist;with caregiver independent in assisting;sitting Pt Will Transfer to Toilet: Independently;ambulating;regular height  toilet Pt Will Perform Toileting - Clothing Manipulation and hygiene: Independently;sitting/lateral leans  Plan Discharge plan remains appropriate;Frequency remains appropriate    Co-evaluation                 AM-PAC OT "6 Clicks" Daily Activity     Outcome Measure   Help from another person eating meals?: A Little Help from another person taking care of personal grooming?: A Little Help from another person toileting, which includes using toliet, bedpan, or urinal?: A Little Help from another person bathing (including washing, rinsing, drying)?: A Lot Help from another person to put on and taking off regular upper  body clothing?: A Lot Help from another person to put on and taking off regular lower body clothing?: A Little 6 Click Score: 16    End of Session    OT Visit Diagnosis: Unsteadiness on feet (R26.81)   Activity Tolerance Patient tolerated treatment well   Patient Left in bed;with call bell/phone within reach;with bed alarm set   Nurse Communication          Time: 1030-1314 OT Time Calculation (min): 13 min  Charges: OT General Charges $OT Visit: 1 Visit OT Treatments $Self Care/Home Management : 8-22 mins  Kathie Dike, M.S. OTR/L  04/05/21, 1:39 PM  ascom [phone]

## 2021-04-05 NOTE — Progress Notes (Signed)
Physical Therapy Treatment Patient Details Name: Brandi Wiggins MRN: [id number] DOB: 1956-02-10 Today's Date: 04/05/2021    History of Present Illness admitted for acute hospitalization s/p R reverse TSA (04/04/21)    PT Comments    Pt was long sitting in bed upon arriving. She is A and O x 4 and extremely pleasant. Used SPC throughout this session and was able to demonstrate safe enough abilities to DC home with outpatient PT. She was able to ambulate 200 ft and perform ascending/descending step 2 x with cane without safety concern. Cleared from acute PT standpoint for safe DC home.      Follow Up Recommendations  Follow surgeon's recommendation for DC plan and follow-up therapies     Equipment Recommendations  Cane       Precautions / Restrictions Precautions Precautions: Fall Required Braces or Orthoses: Sling Restrictions Weight Bearing Restrictions: Yes RUE Weight Bearing: Non weight bearing    Mobility  Bed Mobility Overal bed mobility: Modified Independent Bed Mobility: Supine to Sit;Sit to Supine     Supine to sit: Supervision Sit to supine: Supervision   General bed mobility comments: pt was able to exit and re-enter bed without physical assistance. educated on sequencing and techniques for home set up/without rails    Transfers Overall transfer level: Needs assistance Equipment used: Straight cane Transfers: Sit to/from Stand Sit to Stand: Supervision            Ambulation/Gait Ambulation/Gait assistance: Supervision Gait Distance (Feet): 200 Feet Assistive device: Straight cane Gait Pattern/deviations: Step-through pattern;Drifts right/left Gait velocity: decreased   General Gait Details: pt ambulated 200 ft with SPC. did have some drifting noted but overall demonstrates safe gait kinematics   Stairs Stairs: Yes Stairs assistance: Min guard Stair Management: No rails;With cane Number of Stairs: 2 General stair comments: pt was able to  ascend/descend step 2 x with use of SPC + CGA      Balance Overall balance assessment: Needs assistance Sitting-balance support: No upper extremity supported;Feet supported Sitting balance-Leahy Scale: Good     Standing balance support: Single extremity supported;During functional activity Standing balance-Leahy Scale: Fair        Cognition Arousal/Alertness: Awake/alert Behavior During Therapy: WFL for tasks assessed/performed Overall Cognitive Status: Within Functional Limits for tasks assessed      General Comments: pt is A and O x 4             Pertinent Vitals/Pain Pain Assessment: No/denies pain     PT Goals (current goals can now be found in the care plan section) Acute Rehab PT Goals Patient Stated Goal: go home Progress towards PT goals: Progressing toward goals    Frequency    7X/week      PT Plan Current plan remains appropriate       AM-PAC PT "6 Clicks" Mobility   Outcome Measure  Help needed turning from your back to your side while in a flat bed without using bedrails?: None Help needed moving from lying on your back to sitting on the side of a flat bed without using bedrails?: None Help needed moving to and from a bed to a chair (including a wheelchair)?: A Little Help needed standing up from a chair using your arms (e.g., wheelchair or bedside chair)?: A Little Help needed to walk in hospital room?: A Little Help needed climbing 3-5 steps with a railing? : A Little 6 Click Score: 20    End of Session Equipment Utilized During Treatment: Gait belt Activity  Tolerance: Patient tolerated treatment well Patient left: in bed;with bed alarm set;with call bell/phone within reach Nurse Communication: Mobility status PT Visit Diagnosis: Other abnormalities of gait and mobility (R26.89)     Time: 1040-1100 PT Time Calculation (min) (ACUTE ONLY): 20 min  Charges:  $Gait Training: 8-22 mins                     Jetta Lout PTA 04/05/21,  12:10 PM

## 2021-04-05 NOTE — Plan of Care (Signed)
  Problem: Education: Goal: Knowledge of General Education information will improve Description: Including pain rating scale, medication(s)/side effects and non-pharmacologic comfort measures 04/05/2021 1320 by Dacey Milberger, Debbe Mounts, RN Outcome: Completed/Met 04/05/2021 1012 by Elsie Ra, RN Outcome: Progressing   Problem: Health Behavior/Discharge Planning: Goal: Ability to manage health-related needs will improve 04/05/2021 1320 by Jd Mccaster, Debbe Mounts, RN Outcome: Completed/Met 04/05/2021 1012 by Elsie Ra, RN Outcome: Progressing   Problem: Clinical Measurements: Goal: Ability to maintain clinical measurements within normal limits will improve 04/05/2021 1320 by Amoree Newlon, Debbe Mounts, RN Outcome: Completed/Met 04/05/2021 1012 by Elsie Ra, RN Outcome: Progressing Goal: Will remain free from infection 04/05/2021 1320 by Haasini Patnaude, Debbe Mounts, RN Outcome: Completed/Met 04/05/2021 1012 by Elsie Ra, RN Outcome: Progressing Goal: Diagnostic test results will improve 04/05/2021 1320 by Rod Majerus, Debbe Mounts, RN Outcome: Completed/Met 04/05/2021 1012 by Elsie Ra, RN Outcome: Progressing Goal: Respiratory complications will improve 04/05/2021 1320 by Dontrae Morini, Debbe Mounts, RN Outcome: Completed/Met 04/05/2021 1012 by Elsie Ra, RN Outcome: Progressing Goal: Cardiovascular complication will be avoided 04/05/2021 1320 by Venera Privott, Debbe Mounts, RN Outcome: Completed/Met 04/05/2021 1012 by Elsie Ra, RN Outcome: Progressing   Problem: Activity: Goal: Risk for activity intolerance will decrease 04/05/2021 1320 by Fronie Holstein, Debbe Mounts, RN Outcome: Completed/Met 04/05/2021 1012 by Elsie Ra, RN Outcome: Progressing   Problem: Nutrition: Goal: Adequate nutrition will be maintained 04/05/2021 1320 by Kirstan Fentress, Debbe Mounts, RN Outcome: Completed/Met 04/05/2021 1012 by Elsie Ra, RN Outcome: Progressing   Problem: Coping: Goal: Level  of anxiety will decrease 04/05/2021 1320 by Sadiyah Kangas, Debbe Mounts, RN Outcome: Completed/Met 04/05/2021 1012 by Elsie Ra, RN Outcome: Progressing   Problem: Elimination: Goal: Will not experience complications related to bowel motility 04/05/2021 1320 by Olive Motyka, Debbe Mounts, RN Outcome: Completed/Met 04/05/2021 1012 by Elsie Ra, RN Outcome: Progressing Goal: Will not experience complications related to urinary retention 04/05/2021 1320 by Tawny Raspberry, Debbe Mounts, RN Outcome: Completed/Met 04/05/2021 1012 by Elsie Ra, RN Outcome: Progressing   Problem: Pain Managment: Goal: General experience of comfort will improve 04/05/2021 1320 by Mildred Bollard, Debbe Mounts, RN Outcome: Completed/Met 04/05/2021 1012 by Elsie Ra, RN Outcome: Progressing   Problem: Safety: Goal: Ability to remain free from injury will improve 04/05/2021 1320 by Brnadon Eoff, Debbe Mounts, RN Outcome: Completed/Met 04/05/2021 1012 by Elsie Ra, RN Outcome: Progressing   Problem: Skin Integrity: Goal: Risk for impaired skin integrity will decrease 04/05/2021 1320 by Elsie Ra, RN Outcome: Completed/Met 04/05/2021 1012 by Elsie Ra, RN Outcome: Progressing

## 2021-04-05 NOTE — Plan of Care (Signed)

## 2021-04-05 NOTE — Discharge Summary (Signed)
Physician Discharge Summary  Patient ID: Arvis Miguez MRN: [id number] DOB/AGE: [birth year] 65 y.o.  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   Consultants (if any):   Discharged Condition: Improved  Hospital Course: Leslee Suire is an 65 y.o. 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    Surgeries: Procedure(s): REVERSE SHOULDER ARTHROPLASTY on 04/04/2021 Patient tolerated the surgery well. Taken to PACU where she was stabilized and then transferred to the orthopedic floor.  Started on Lovenox 40 q 24 hrs. Foot pumps applied bilaterally at 80 mm. Heels elevated on bed with rolled towels. No evidence of DVT. Negative Homan. Physical therapy started on day #1 for gait training and transfer. OT started day #1 for ADL and assisted devices.  Patient's IV was removed on POD1.  Implants: All press-fit [employer] Comprehensive system with a #11 micro-humeral stem, a 40 mm humeral tray with a standard insert, and a mini-base plate with a 36 mm glenosphere.  She was given perioperative antibiotics:  Anti-infectives (From admission, onward)    Start     Dose/Rate Route Frequency Ordered Stop   04/04/21 1400  ceFAZolin (ANCEF) IVPB 2g/100 mL premix        2 g 200 mL/hr over 30 Minutes Intravenous Every 6 hours 04/04/21 1132 04/05/21 0235   04/04/21 0600  ceFAZolin (ANCEF) IVPB 3g/100 mL premix  Status:  Discontinued        3 g 200  mL/hr over 30 Minutes Intravenous On call to O.R. 04/03/21 2243 04/04/21 1128     .  She was given sequential compression devices, early ambulation, and Lovenox for DVT prophylaxis.  She benefited maximally from the hospital stay and there were no complications.    Recent vital signs:  Vitals:   04/05/21 0000 04/05/21 0421  BP: 117/70 103/62  Pulse: 78 73  Resp: 20 20  Temp: 97.6 F (36.4 C) 97.8 F (36.6 C)  SpO2: 100% 98%    Recent laboratory studies:  Lab Results  Component Value Date   HGB 12.2 04/05/2021   HGB 13.6 03/24/2021   HGB 13.2 04/04/2020   Lab Results  Component Value Date   WBC 11.3 (H) 04/05/2021   PLT 349 04/05/2021   No results found for: INR Lab Results  Component Value Date   NA 130 (L) 04/05/2021   K 3.8 04/05/2021   CL 99 04/05/2021   CO2 22 04/05/2021   BUN 20 04/05/2021   CREATININE 0.83 04/05/2021   GLUCOSE 111 (H) 04/05/2021    Discharge Medications:   Allergies as of 04/05/2021   No Known Allergies      Medication List     TAKE these medications    aspirin EC 325 MG tablet Take 1 tablet (325 mg total) by mouth daily.   HYDROcodone-acetaminophen 5-325 MG tablet Commonly known as: NORCO/VICODIN Take 1-2 tablets by mouth every 6 (six) hours as needed for moderate pain (pain score 4-6). What changed:  how much to take reasons  to take this   ibuprofen 800 MG tablet Commonly known as: ADVIL Take 800 mg by mouth 2 (two) times daily as needed for moderate pain.   losartan-hydrochlorothiazide 100-25 MG tablet Commonly known as: HYZAAR Take 1 tablet by mouth daily.   ondansetron 4 MG tablet Commonly known as: ZOFRAN Take 1 tablet (4 mg total) by mouth every 6 (six) hours as needed for nausea.       Diagnostic Studies: DG Shoulder Right Port  Result Date: 04/04/2021 CLINICAL DATA:  Reverse arthroplasty RIGHT shoulder EXAM: PORTABLE RIGHT SHOULDER COMPARISON:  None. FINDINGS: LEFT shoulder arthroplasty. Prosthetic  components appear well seated. Expected soft tissue postsurgical findings. IMPRESSION: No complication following RIGHT shoulder arthroplasty Electronically Signed   By: Genevive Bi M.D.   On: 04/04/2021 11:33   [place] OR NERVE BLOCK-IMAGE ONLY [facility])  Result Date: 04/04/2021 There is no interpretation for this exam.  This order is for images obtained during a surgical procedure.  Please See "Surgeries" Tab for more information regarding the procedure.    Disposition: Plan for discharge home with daughter today pending progress with PT.   Follow-up Information     Anson Oregon, PA-C Follow up in 14 day(s).   Specialty: Physician Assistant Why: Wenda [employer] Contact information: [address] [place] [address] [phone]                Signed: Meriel Pica PA-C 04/05/2021, 7:54 AM

## 2021-04-05 NOTE — Progress Notes (Signed)
  Subjective: 1 Day Post-Op Procedure(s) (LRB): REVERSE SHOULDER ARTHROPLASTY (Right) Patient reports pain as 0 on 0-10 scale.   Patient is well, and has had no acute complaints or problems Plan is to go Home after hospital stay. Negative for chest pain and shortness of breath Fever: no Gastrointestinal:Negative for nausea and vomiting  Objective: Vital signs in last 24 hours: Temp:  [97.1 F (36.2 C)-98.5 F (36.9 C)] 97.8 F (36.6 C) (08/17 0421) Pulse Rate:  [73-90] 73 (08/17 0421) Resp:  [14-20] 20 (08/17 0421) BP: (97-125)/(53-87) 103/62 (08/17 0421) SpO2:  [96 %-100 %] 98 % (08/17 0421)  Intake/Output from previous day:  Intake/Output Summary (Last 24 hours) at 04/05/2021 0748 Last data filed at 04/04/2021 2327 Gross per 24 hour  Intake 1988.75 ml  Output 150 ml  Net 1838.75 ml    Intake/Output this shift: No intake/output data recorded.  Labs: Recent Labs    04/05/21 0322  HGB 12.2   Recent Labs    04/05/21 0322  WBC 11.3*  RBC 3.98  HCT 35.5*  PLT 349   Recent Labs    04/05/21 0322  NA 130*  K 3.8  CL 99  CO2 22  BUN 20  CREATININE 0.83  GLUCOSE 111*  CALCIUM 10.2   No results for input(s): LABPT, INR in the last 72 hours.   EXAM General - Patient is Alert, Appropriate, and Oriented Extremity - ABD soft Incision: dressing C/D/I No cellulitis present Decreased sensation to light touch to the right arm from recent nerve block. Dressing/Incision - clean, dry, no drainage Motor Function - intact, moving foot and toes well on exam.  ABdomen soft with normal bowel sounds. Some difficulty moving fingers and wrist from nerve block but sensation is returning. Negative Homans to bilateral lower extremities.  Past Medical History:  Diagnosis Date   Hypertension     Assessment/Plan: 1 Day Post-Op Procedure(s) (LRB): REVERSE SHOULDER ARTHROPLASTY (Right) Active Problems:   Status post reverse arthroplasty of shoulder, right  Estimated body  mass index is 38.53 kg/m as calculated from the following:   Height as of this encounter: 5\' 7"  (1.702 m).   Weight as of this encounter: 111.6 kg. Advance diet Up with therapy D/C IV fluids when tolerating po intake.  Labs reviewed this AM.  WBC 11.3. Vitals stable. Up with therapy today. Plan for discharge home this afternoon pending progress with PT.  DVT Prophylaxis - Lovenox, Foot Pumps, and TED hose Non-weightbearing to the right arm.  , PA-C [facility] Orthopaedic Surgery 04/05/2021, 7:48 AM

## 2021-04-06 LAB — SURGICAL PATHOLOGY

## 2021-06-14 ENCOUNTER — Other Ambulatory Visit: Payer: Self-pay | Admitting: Family Medicine

## 2021-06-14 DIAGNOSIS — Z139 Encounter for screening, unspecified: Secondary | ICD-10-CM

## 2021-06-16 ENCOUNTER — Ambulatory Visit
Admission: RE | Admit: 2021-06-16 | Discharge: 2021-06-16 | Disposition: A | Discharge status: Home / Self Care | Payer: BC Managed Care – PPO | Source: Ambulatory Visit | Attending: Family Medicine | Admitting: Family Medicine

## 2021-06-16 ENCOUNTER — Other Ambulatory Visit: Payer: Self-pay

## 2021-06-16 ENCOUNTER — Inpatient Hospital Stay: Admission: RE | Admit: 2021-06-16 | Payer: BC Managed Care – PPO | Source: Ambulatory Visit

## 2021-06-16 DIAGNOSIS — Z139 Encounter for screening, unspecified: Secondary | ICD-10-CM

## 2022-02-28 ENCOUNTER — Ambulatory Visit
Admission: EM | Admit: 2022-02-28 | Discharge: 2022-02-28 | Disposition: A | Discharge status: Home / Self Care | Payer: BC Managed Care – PPO | Attending: Internal Medicine | Admitting: Internal Medicine

## 2022-02-28 ENCOUNTER — Encounter: Payer: Self-pay | Admitting: Emergency Medicine

## 2022-02-28 DIAGNOSIS — J40 Bronchitis, not specified as acute or chronic: Secondary | ICD-10-CM | POA: Diagnosis not present

## 2022-02-28 MED ORDER — IPRATROPIUM-ALBUTEROL 0.5-2.5 (3) MG/3ML IN SOLN
3.0000 mL | Freq: Once | RESPIRATORY_TRACT | Status: AC
  Administered 2022-02-28: 3 mL via RESPIRATORY_TRACT

## 2022-02-28 MED ORDER — DOXYCYCLINE HYCLATE 100 MG PO CAPS: 100.0000 mg | ORAL_CAPSULE | Freq: Two times a day (BID) | ORAL | 0 refills | Status: DC

## 2022-02-28 MED ORDER — ALBUTEROL SULFATE HFA 108 (90 BASE) MCG/ACT IN AERS: 2.0000 | INHALATION_SPRAY | RESPIRATORY_TRACT | 0 refills | Status: AC | PRN

## 2022-02-28 NOTE — ED Provider Notes (Addendum)
[facility]    CSN: [id number] Arrival date & time: 02/28/22  0802      History   Chief Complaint Chief Complaint  Patient presents with   Cough   Nasal Congestion   Back Pain    HPI Brandi Wiggins is a 66 y.o. female who presents onset of cough x 3 days, nose congestion started the day after, and since yesterday has productive cough of   brown to yellow mucous. Has been wheezing since yesterday. She is a smoker and feels like when she had bronchitis in the past. She has been feeling L low thoracic back pain when she coughs. She denies fever, chills, SOM, hemoptysis, fatigue, change in appetite or sweats.    Past Medical History:  Diagnosis Date   Hypertension     Patient Active Problem List   Diagnosis Date Noted   Status post reverse arthroplasty of shoulder, right 04/04/2021   Bone lesion 04/01/2020    Past Surgical History:  Procedure Laterality Date   REVERSE SHOULDER ARTHROPLASTY Right 04/04/2021   Procedure: REVERSE SHOULDER ARTHROPLASTY;  Surgeon: Christena Flake, MD;  Location: [facility] ORS;  Service: Orthopedics;  Laterality: Right;   SHOULDER SURGERY      OB History   No obstetric history on file.      Home Medications    Prior to Admission medications   Medication Sig Start Date End Date Taking? Authorizing Provider  aspirin EC 325 MG tablet Take 1 tablet (325 mg total) by mouth daily. 04/05/21   Anson Oregon, PA-C  HYDROcodone-acetaminophen (NORCO/VICODIN) 5-325 MG tablet Take 1-2 tablets by mouth every 6 (six) hours as needed for moderate pain (pain score 4-6). 04/05/21   Anson Oregon, PA-C  ibuprofen (ADVIL) 800 MG tablet Take 800 mg by mouth 2 (two) times daily as needed for moderate pain. 03/28/20   [provider]  losartan-hydrochlorothiazide (HYZAAR) 100-25 MG tablet Take 1 tablet by mouth daily. 12/01/19   [provider]  ondansetron (ZOFRAN) 4 MG tablet Take 1 tablet (4 mg total) by mouth every 6 (six)  hours as needed for nausea. 04/05/21   Anson Oregon, PA-C    Family History Family History  Problem Relation Age of Onset   Cancer Mother    CAD Father     Social History Social History   Tobacco Use   Smoking status: Every Day    Packs/day: 0.50    Types: Cigarettes   Smokeless tobacco: Never  Vaping Use   Vaping Use: Never used  Substance Use Topics   Alcohol use: Yes   Drug use: No     Allergies   Patient has no known allergies.   Review of Systems Review of Systems  Constitutional:  Negative for activity change, appetite change, chills, diaphoresis, fatigue and fever.  HENT:  Positive for congestion. Negative for ear discharge, ear pain, rhinorrhea and sore throat.   Eyes:  Negative for discharge.  Respiratory:  Positive for cough and wheezing. Negative for chest tightness and shortness of breath.   Cardiovascular:  Negative for chest pain.  Musculoskeletal:  Negative for myalgias.  Neurological:  Negative for headaches.     Physical Exam Triage Vital Signs ED Triage Vitals  Enc Vitals Group     BP 02/28/22 0823 (!) 148/94     Pulse Rate 02/28/22 0823 84     Resp 02/28/22 0823 17     Temp 02/28/22 0823 97.9 F (36.6 C)  Temp Source 02/28/22 0823 Oral     SpO2 02/28/22 0823 96 %     Weight --      Height --      Head Circumference --      Peak Flow --      Pain Score 02/28/22 0822 4     Pain Loc --      Pain Edu? --      Excl. in GC? --    No data found.  Updated Vital Signs BP (!) 148/94 (BP Location: Left Arm)   Pulse 84   Temp 97.9 F (36.6 C) (Oral)   Resp 17   SpO2 96%   Visual Acuity Right Eye Distance:   Left Eye Distance:   Bilateral Distance:    Right Eye Near:   Left Eye Near:    Bilateral Near:     Physical Exam Constitutional:      General: He is not in acute distress.    Appearance: He is not toxic-appearing.  HENT:     Head: Normocephalic.     Right Ear: Tympanic membrane, ear canal and external ear  normal.     Left Ear: Ear canal and external ear normal.     Nose: Nose normal.     Mouth/Throat:     Mouth: Mucous membranes are moist.     Pharynx: Oropharynx is clear.  Eyes:     General: No scleral icterus.    Conjunctiva/sclera: Conjunctivae normal.  Cardiovascular:     Rate and Rhythm: Normal rate and regular rhythm.     Heart sounds: No murmur heard.   Pulmonary:     Effort: Pulmonary effort is normal. No respiratory distress.     Breath sounds: Wheezing present.     Comments: Has auditory wheezing all over. Has local tenderness on L mid back muscular region which reproduced her pain.  Musculoskeletal:        General: Normal range of motion.     Cervical back: Neck supple.  Lymphadenopathy:     Cervical: No cervical adenopathy.  Skin:    General: Skin is warm and dry.     Findings: No rash.  Neurological:     Mental Status: He is alert and oriented to person, place, and time.     Gait: Gait normal.  Psychiatric:        Mood and Affect: Mood normal.        Behavior: Behavior normal.        Thought Content: Thought content normal.        Judgment: Judgment normal.    UC Treatments / Results  Labs (all labs ordered are listed, but only abnormal results are displayed) Labs Reviewed - No data to display  EKG   Radiology No results found.  Procedures Procedures (including critical care time)  Medications Ordered in UC Medications  ipratropium-albuterol (DUONEB) 0.5-2.5 (3) MG/3ML nebulizer solution 3 mL (has no administration in time range)    Initial Impression / Assessment and Plan / UC Course  I have reviewed the triage vital signs and the nursing notes. She was given a duo neb treatment and her wheezing improved.  Has acute bronchitis  I placed her on Doxy and Albuterol inhaler as noted. See instructions.      Final Clinical Impressions(s) / UC Diagnoses   Final diagnoses:  None   Discharge Instructions   None    ED Prescriptions    None    PDMP not reviewed this encounter.  Garey Ham, PA-C 02/28/22 1035    Rodriguez-[place], [place], PA-C 02/28/22 1036

## 2022-02-28 NOTE — Discharge Instructions (Addendum)
Since you have not been smoking due to your illness, work on staying off smoking. If you get worse, please come back or see your primary care provider for follow up.

## 2022-02-28 NOTE — ED Triage Notes (Signed)
Pt reports a productive cough, nasal congestion since Saturday and lower back pain when coughing beginning lastnight.  States she feels like she has bronchitis due to similar symptoms in the past.

## 2022-05-18 ENCOUNTER — Other Ambulatory Visit: Payer: Self-pay | Admitting: Family Medicine

## 2022-05-18 DIAGNOSIS — Z1231 Encounter for screening mammogram for malignant neoplasm of breast: Secondary | ICD-10-CM

## 2022-05-23 ENCOUNTER — Ambulatory Visit
Admission: RE | Admit: 2022-05-23 | Discharge: 2022-05-23 | Disposition: A | Discharge status: Home / Self Care | Payer: BC Managed Care – PPO | Source: Ambulatory Visit | Attending: Family Medicine | Admitting: Family Medicine

## 2022-05-23 DIAGNOSIS — Z1231 Encounter for screening mammogram for malignant neoplasm of breast: Secondary | ICD-10-CM

## 2023-05-09 ENCOUNTER — Other Ambulatory Visit: Payer: Self-pay | Admitting: Family Medicine

## 2023-05-09 DIAGNOSIS — Z1231 Encounter for screening mammogram for malignant neoplasm of breast: Secondary | ICD-10-CM

## 2023-05-20 ENCOUNTER — Ambulatory Visit
Admission: RE | Admit: 2023-05-20 | Discharge: 2023-05-20 | Disposition: A | Discharge status: Home / Self Care | Payer: BC Managed Care – PPO | Source: Ambulatory Visit | Attending: Family Medicine | Admitting: Family Medicine

## 2023-05-20 DIAGNOSIS — Z1231 Encounter for screening mammogram for malignant neoplasm of breast: Secondary | ICD-10-CM

## 2023-07-13 IMAGING — MG MM DIGITAL SCREENING BILAT W/ TOMO AND CAD
6 of 12 series · 6 of 36 positions shown · non-contrast
Comparison: Previous exam(s).

CLINICAL DATA: Screening.

EXAM:
DIGITAL SCREENING BILATERAL MAMMOGRAM WITH TOMOSYNTHESIS AND CAD
TECHNIQUE: Bilateral screening digital craniocaudal and mediolateral oblique
mammograms were obtained. Bilateral screening digital breast
tomosynthesis was performed. The images were evaluated with
computer-aided detection.

[L MLO synth-2D]
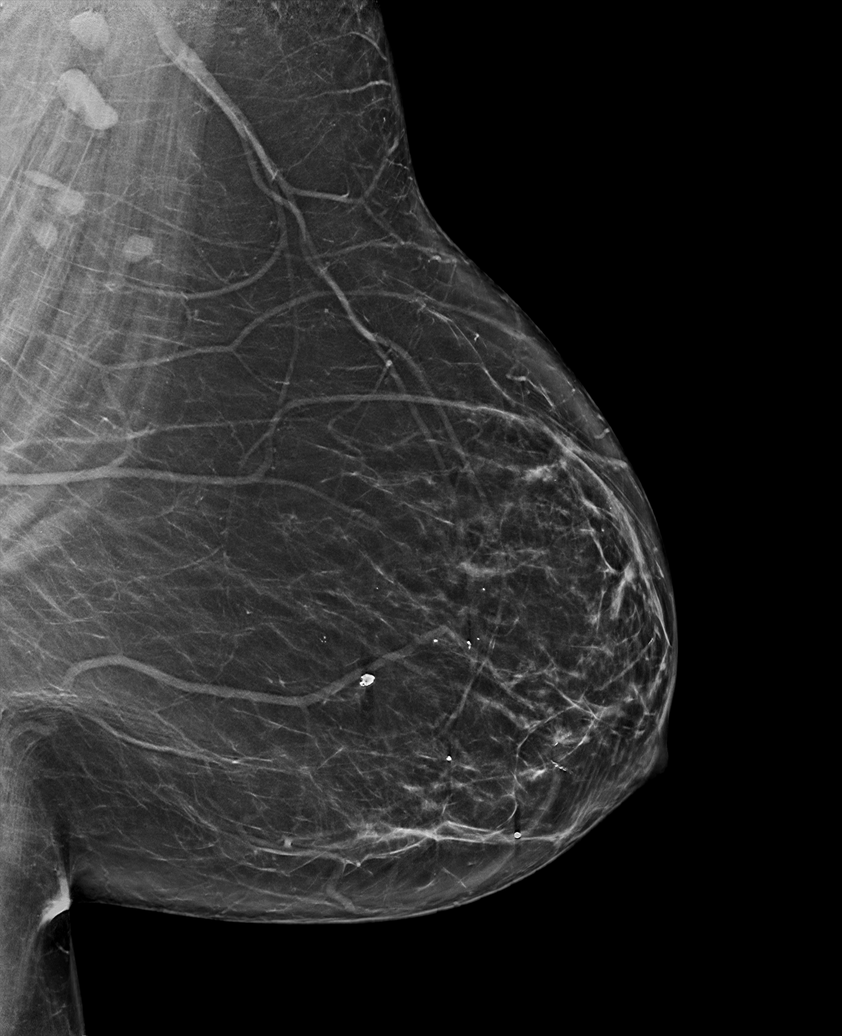

[R CC synth-2D]
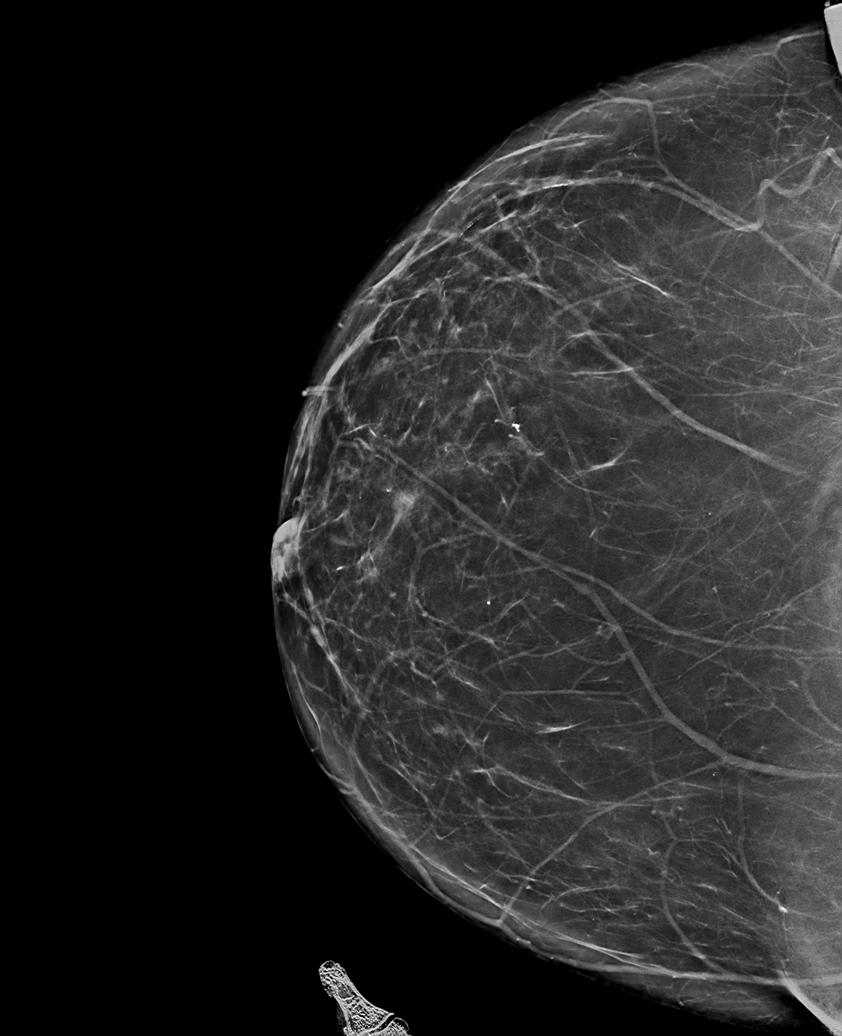

[L CC synth-2D]
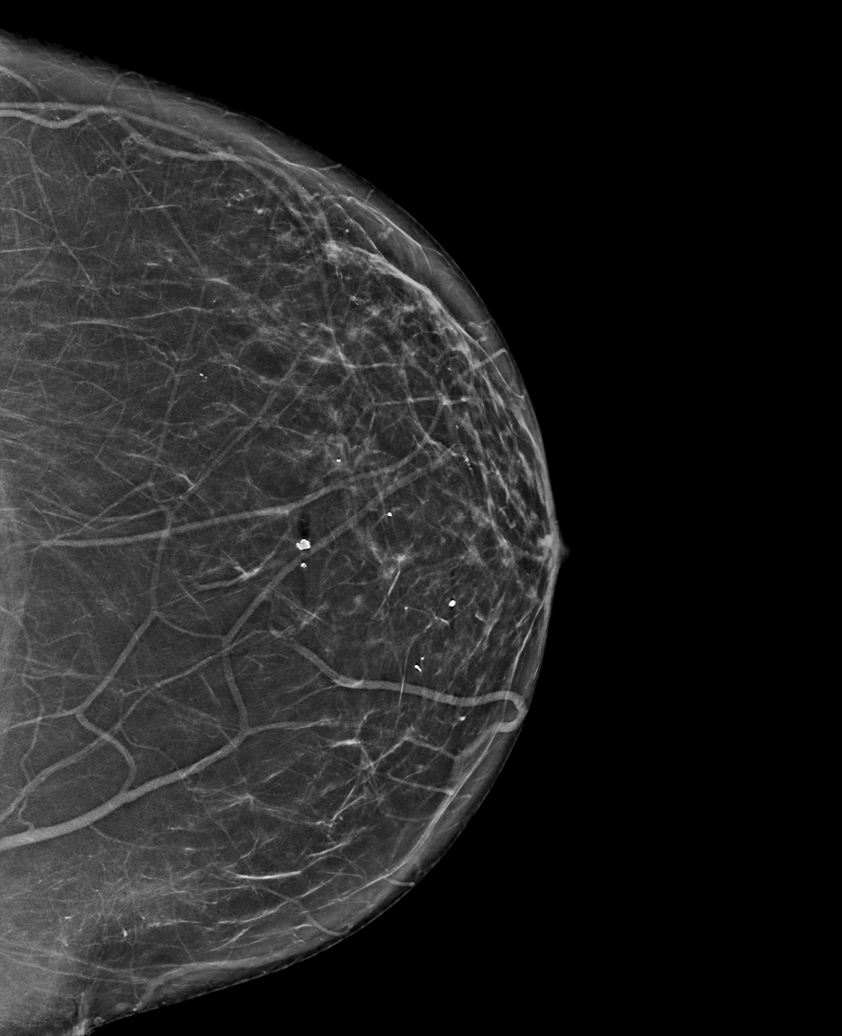

[R MLO synth-2D (1 of 2)]
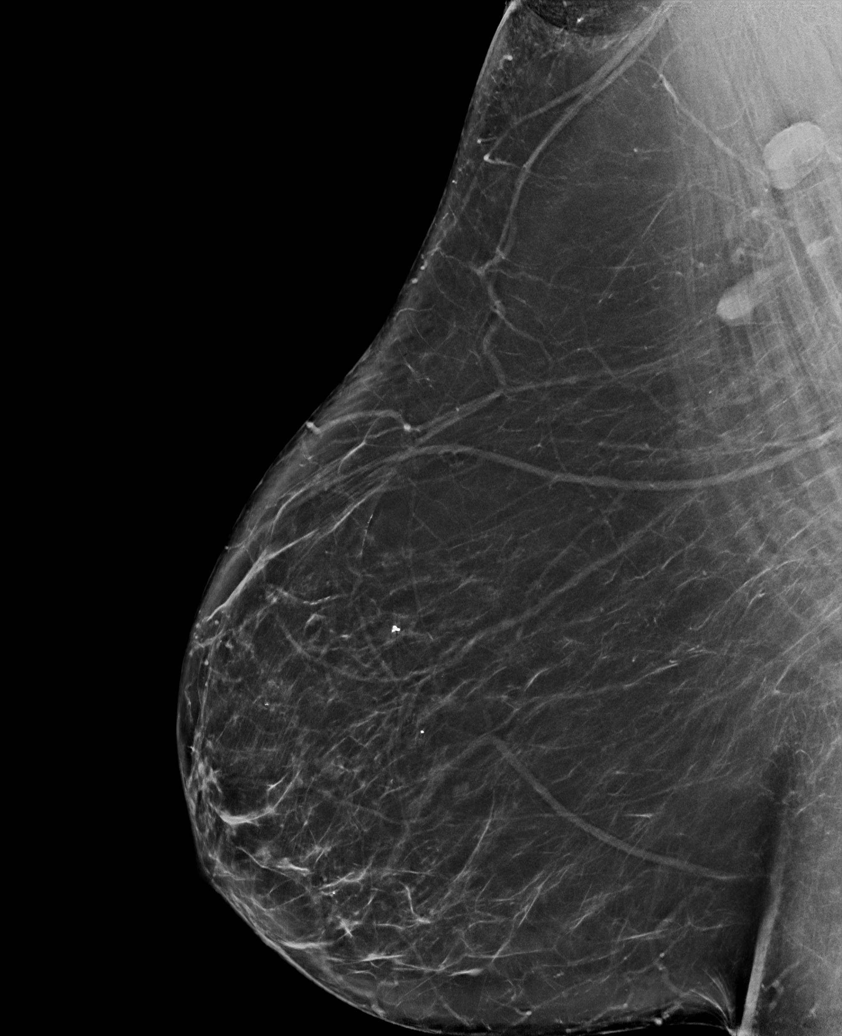

[R CV synth-2D]
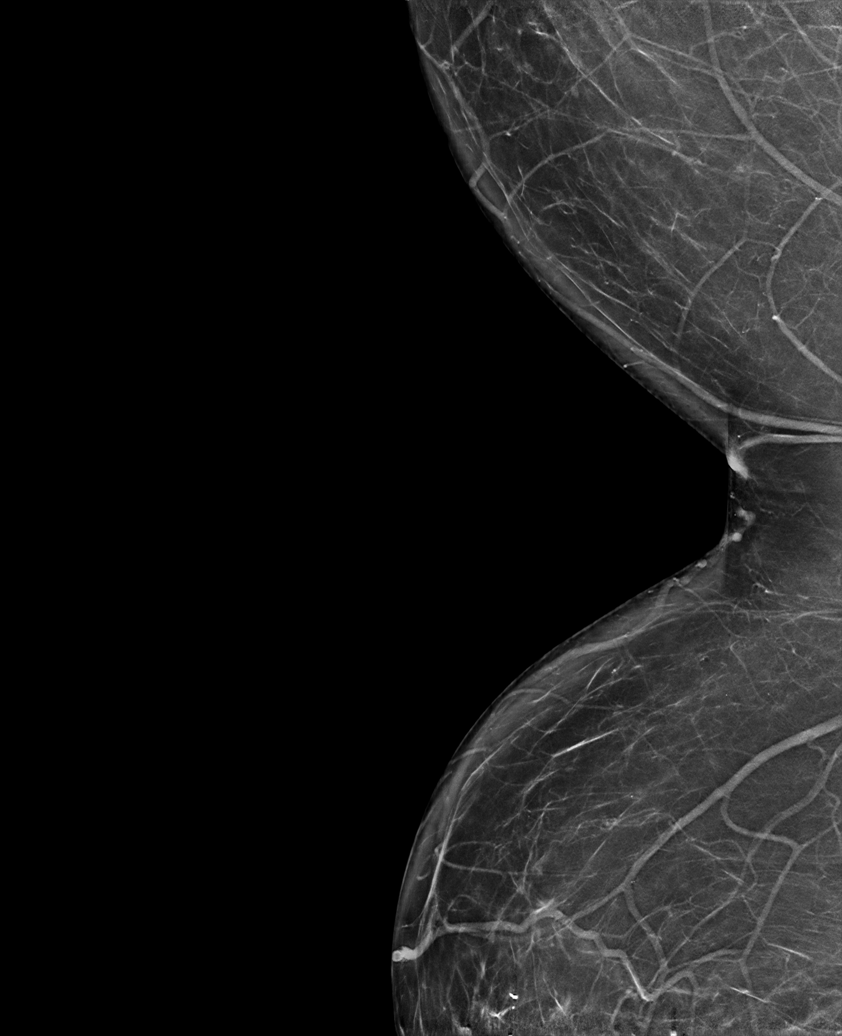

[R MLO synth-2D (2 of 2)]
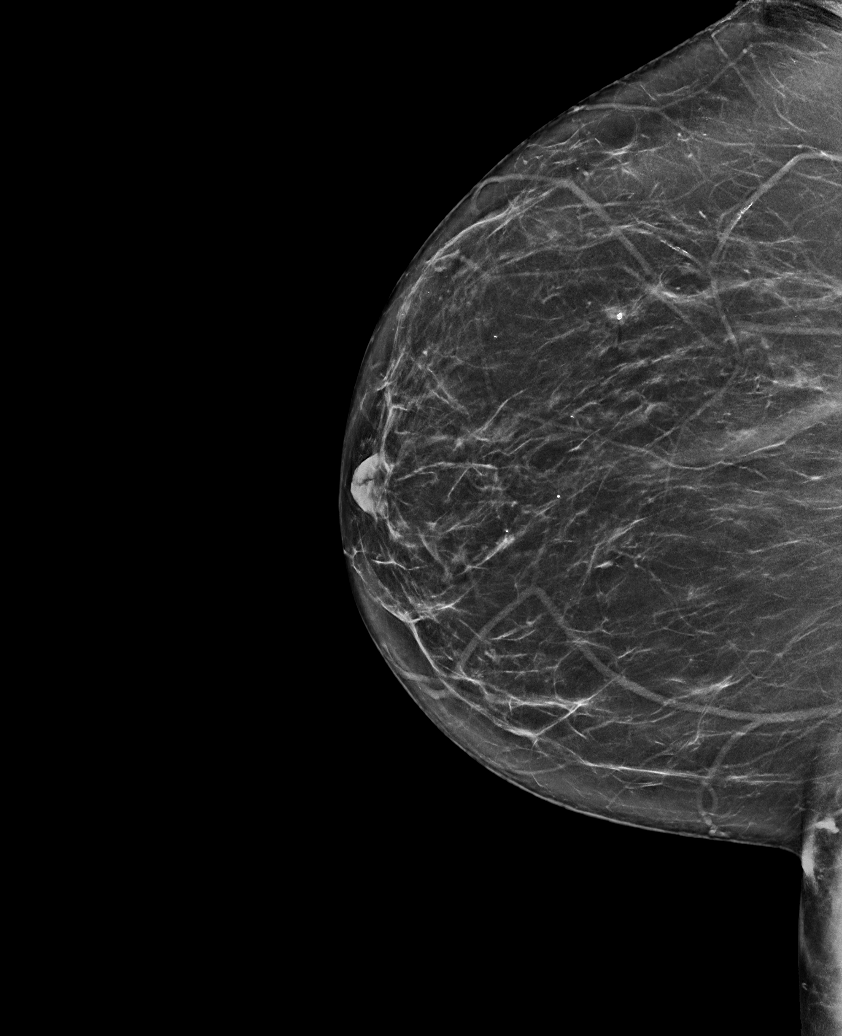

[6 of 36 positions shown; findings below may reference images not displayed]

ACR Breast Density Category b: There are scattered areas of
fibroglandular density.
FINDINGS: There are no findings suspicious for malignancy.
IMPRESSION: No mammographic evidence of malignancy. A result letter of this
screening mammogram will be mailed directly to the patient.

RECOMMENDATION:
Screening mammogram in one year. (Code:51-O-LD2)

BI-RADS CATEGORY  1: Negative.

## 2024-01-06 LAB — COLOGUARD: COLOGUARD: NEGATIVE

## 2024-04-07 ENCOUNTER — Other Ambulatory Visit: Payer: Self-pay | Admitting: Surgery

## 2024-04-14 ENCOUNTER — Encounter
Admission: RE | Admit: 2024-04-14 | Discharge: 2024-04-14 | Disposition: A | Discharge status: Home / Self Care | Source: Ambulatory Visit | Attending: Surgery | Admitting: Surgery

## 2024-04-14 ENCOUNTER — Inpatient Hospital Stay: Admission: RE | Admit: 2024-04-14 | Source: Ambulatory Visit

## 2024-04-14 ENCOUNTER — Other Ambulatory Visit: Payer: Self-pay

## 2024-04-14 VITALS — BP 148/92 | HR 100 | Temp 97.3°F | Resp 18 | Ht 68.0 in | Wt 274.5 lb

## 2024-04-14 DIAGNOSIS — Z01812 Encounter for preprocedural laboratory examination: Secondary | ICD-10-CM

## 2024-04-14 DIAGNOSIS — Z01818 Encounter for other preprocedural examination: Secondary | ICD-10-CM | POA: Insufficient documentation

## 2024-04-14 HISTORY — DX: Personal history of other diseases of the respiratory system: Z87.09

## 2024-04-14 HISTORY — DX: Prediabetes: R73.03

## 2024-04-14 HISTORY — DX: Unilateral primary osteoarthritis, right knee: M17.11

## 2024-04-14 HISTORY — DX: Morbid (severe) obesity due to excess calories: E66.01

## 2024-04-14 HISTORY — DX: Headache, unspecified: R51.9

## 2024-04-14 HISTORY — DX: Gastro-esophageal reflux disease without esophagitis: K21.9

## 2024-04-14 LAB — CBC WITH DIFFERENTIAL/PLATELET
Abs Immature Granulocytes: 0.01 K/uL (ref 0.00–0.07)
Basophils Absolute: 0 K/uL (ref 0.0–0.1)
Basophils Relative: 1 %
Eosinophils Absolute: 0.2 K/uL (ref 0.0–0.5)
Eosinophils Relative: 3 %
HCT: 39.4 % (ref 36.0–46.0)
Hemoglobin: 12.6 g/dL (ref 12.0–15.0)
Immature Granulocytes: 0 %
Lymphocytes Relative: 49 %
Lymphs Abs: 3.5 K/uL (ref 0.7–4.0)
MCH: 27 pg (ref 26.0–34.0)
MCHC: 32 g/dL (ref 30.0–36.0)
MCV: 84.4 fL (ref 80.0–100.0)
Monocytes Absolute: 0.7 K/uL (ref 0.1–1.0)
Monocytes Relative: 10 %
Neutro Abs: 2.7 K/uL (ref 1.7–7.7)
Neutrophils Relative %: 37 %
Platelets: 407 K/uL — ABNORMAL HIGH (ref 150–400)
RBC: 4.67 MIL/uL (ref 3.87–5.11)
RDW: 14.4 % (ref 11.5–15.5)
WBC: 7.1 K/uL (ref 4.0–10.5)
nRBC: 0 % (ref 0.0–0.2)

## 2024-04-14 LAB — COMPREHENSIVE METABOLIC PANEL WITH GFR
ALT: 23 U/L (ref 0–44)
AST: 23 U/L (ref 15–41)
Albumin: 3.4 g/dL — ABNORMAL LOW (ref 3.5–5.0)
Alkaline Phosphatase: 91 U/L (ref 38–126)
Anion gap: 5 (ref 5–15)
BUN: 26 mg/dL — ABNORMAL HIGH (ref 8–23)
CO2: 28 mmol/L (ref 22–32)
Calcium: 9.8 mg/dL (ref 8.9–10.3)
Chloride: 105 mmol/L (ref 98–111)
Creatinine, Ser: 0.78 mg/dL (ref 0.44–1.00)
GFR, Estimated: >60 mL/min (ref >60)
Glucose, Bld: 89 mg/dL (ref 70–99)
Potassium: 3.5 mmol/L (ref 3.5–5.1)
Sodium: 138 mmol/L (ref 135–145)
Total Bilirubin: 0.5 mg/dL (ref 0.0–1.2)
Total Protein: 6.9 g/dL (ref 6.5–8.1)

## 2024-04-14 LAB — URINALYSIS, ROUTINE W REFLEX MICROSCOPIC
Bilirubin Urine: NEGATIVE
Glucose, UA: NEGATIVE mg/dL
Hgb urine dipstick: NEGATIVE
Ketones, ur: NEGATIVE mg/dL
Leukocytes,Ua: NEGATIVE
Nitrite: NEGATIVE
Protein, ur: NEGATIVE mg/dL
Specific Gravity, Urine: 1.023 (ref 1.005–1.030)
pH: 5 (ref 5.0–8.0)

## 2024-04-14 LAB — SURGICAL PCR SCREEN
MRSA, PCR: NEGATIVE
Staphylococcus aureus: NEGATIVE

## 2024-04-14 NOTE — Patient Instructions (Signed)
 Your procedure is scheduled on: Tuesday 04/21/24  Report to the Registration Desk on the 1st floor of the [facility]. To find out your arrival time, please call [phone] between 1PM - 3PM on: Friday 04/17/24 If your arrival time is 6:00 am, do not arrive before that time as the [facility] entrance doors do not open until 6:00 am.  REMEMBER: Instructions that are not followed completely may result in serious medical risk, up to and including death; or upon the discretion of your surgeon and anesthesiologist your surgery may need to be rescheduled.  Do not eat food after midnight the night before surgery.  No gum chewing or hard candies.  You may however, drink CLEAR liquids up to 2 hours before you are scheduled to arrive for your surgery. Do not drink anything within 2 hours of your scheduled arrival time.  Clear liquids include: - water  - apple juice without pulp - gatorade (not RED colors) - black coffee or tea (Do NOT add milk or creamers to the coffee or tea) Do NOT drink anything that is not on this list.  In addition, your doctor has ordered for you to drink the provided:  Ensure Pre-Surgery Clear Carbohydrate Drink  Drinking this carbohydrate drink up to two hours before surgery helps to reduce insulin resistance and improve patient outcomes. Please complete drinking 2 hours before scheduled arrival time.  One week prior to surgery: Stop Anti-inflammatories (NSAIDS) such as Advil, Aleve, Ibuprofen, meloxicam (MOBIC), Motrin, Naproxen, Naprosyn and Aspirin  based products such as Excedrin, Goody's Powder, BC Powder. Stop ANY OVER THE COUNTER supplements until after surgery.  You may however, continue to take Tylenol  if needed for pain up until the day of surgery.  Stop phentermine (ADIPEX-P) 37.5 MG 7 days before your procedure (take last dose   Continue taking all of your other prescription medications up until the day of surgery.  ON THE DAY OF SURGERY ONLY TAKE THESE  MEDICATIONS WITH SIPS OF WATER:  none  Use inhalers on the day of surgery and bring to the hospital. albuterol  (VENTOLIN  HFA) 108 (90 Base) MCG/ACT inhaler   No Alcohol for 24 hours before or after surgery.  No Smoking including e-cigarettes for 24 hours before surgery.  No chewable tobacco products for at least 6 hours before surgery.  No nicotine patches on the day of surgery.  Do not use any recreational drugs for at least a week (preferably 2 weeks) before your surgery.  Please be advised that the combination of cocaine and anesthesia may have negative outcomes, up to and including death. If you test positive for cocaine, your surgery will be cancelled.  On the morning of surgery brush your teeth with toothpaste and water, you may rinse your mouth with mouthwash if you wish. Do not swallow any toothpaste or mouthwash.  Use CHG Soap or wipes as directed on instruction sheet.  Do not wear jewelry, make-up, hairpins, clips or nail polish.  For welded (permanent) jewelry: bracelets, anklets, waist bands, etc.  Please have this removed prior to surgery.  If it is not removed, there is a chance that hospital personnel will need to cut it off on the day of surgery.  Do not wear lotions, powders, or perfumes.   Do not shave body hair from the neck down 48 hours before surgery.  Contact lenses, hearing aids and dentures may not be worn into surgery.  Do not bring valuables to the hospital. [facility] is not responsible for any  missing/lost belongings or valuables.    Notify your doctor if there is any change in your medical condition (cold, fever, infection).  Wear comfortable clothing (specific to your surgery type) to the hospital.  After surgery, you can help prevent lung complications by doing breathing exercises.  Take deep breaths and cough every 1-2 hours. Your doctor may order a device called an Incentive Spirometer to help you take deep breaths. When coughing or  sneezing, hold a pillow firmly against your incision with both hands. This is called "splinting." Doing this helps protect your incision. It also decreases belly discomfort.  If you are being admitted to the hospital overnight, leave your suitcase in the car. After surgery it may be brought to your room.  In case of increased patient census, it may be necessary for you, the patient, to continue your postoperative care in the Same Day Surgery department.  If you are being discharged the day of surgery, you will not be allowed to drive home. You will need a responsible individual to drive you home and stay with you for 24 hours after surgery.   If you are taking public transportation, you will need to have a responsible individual with you.  Please call the Pre-admissions Testing Dept. at [phone] if you have any questions about these instructions.  Surgery Visitation Policy:  Patients having surgery or a procedure may have two visitors.  Children under the age of 11 must have an adult with them who is not the patient.  Inpatient Visitation:    Visiting hours are 7 a.m. to 8 p.m. Up to four visitors are allowed at one time in a patient room. The visitors may rotate out with other people during the day.  One visitor age 62 or older may stay with the patient overnight and must be in the room by 8 p.m.   Merchandiser, retail to address health-related social needs:  [email or web address]    Pre-operative 5 CHG Bath Instructions   You can play a key role in reducing the risk of infection after surgery. Your skin needs to be as free of germs as possible. You can reduce the number of germs on your skin by washing with CHG (chlorhexidine  gluconate) soap before surgery. CHG is an antiseptic soap that kills germs and continues to kill germs even after washing.   DO NOT use if you have an allergy to chlorhexidine /CHG or antibacterial soaps. If your skin becomes reddened  or irritated, stop using the CHG and notify one of our RNs at [phone].   Please shower with the CHG soap starting 4 days before surgery using the following schedule:     Please keep in mind the following:  DO NOT shave, including legs and underarms, starting the day of your first shower.   You may shave your face at any point before/day of surgery.  Place clean sheets on your bed the day you start using CHG soap. Use a clean washcloth (not used since being washed) for each shower. DO NOT sleep with pets once you start using the CHG.   CHG Shower Instructions:  If you choose to wash your hair and private area, wash first with your normal shampoo/soap.  After you use shampoo/soap, rinse your hair and body thoroughly to remove shampoo/soap residue.  Turn the water OFF and apply about 3 tablespoons (45 ml) of CHG soap to a CLEAN washcloth.  Apply CHG soap ONLY FROM YOUR NECK DOWN TO YOUR TOES (washing for 3-5 minutes)  DO NOT use CHG soap on face, private areas, open wounds, or sores.  Pay special attention to the area where your surgery is being performed.  If you are having back surgery, having someone wash your back for you may be helpful. Wait 2 minutes after CHG soap is applied, then you may rinse off the CHG soap.  Pat dry with a clean towel  Put on clean clothes/pajamas   If you choose to wear lotion, please use ONLY the CHG-compatible lotions on the back of this paper.     Additional instructions for the day of surgery: DO NOT APPLY any lotions, deodorants, cologne, or perfumes.   Put on clean/comfortable clothes.  Brush your teeth.  Ask your nurse before applying any prescription medications to the skin.      CHG Compatible Lotions   Aveeno Moisturizing lotion  Cetaphil Moisturizing Cream  Cetaphil Moisturizing Lotion  Clairol Herbal Essence Moisturizing Lotion, Dry Skin  Clairol Herbal Essence Moisturizing Lotion, Extra Dry Skin  Clairol Herbal Essence  Moisturizing Lotion, Normal Skin  Curel Age Defying Therapeutic Moisturizing Lotion with Alpha Hydroxy  Curel Extreme Care Body Lotion  Curel Soothing Hands Moisturizing Hand Lotion  Curel Therapeutic Moisturizing Cream, Fragrance-Free  Curel Therapeutic Moisturizing Lotion, Fragrance-Free  Curel Therapeutic Moisturizing Lotion, Original Formula  Eucerin Daily Replenishing Lotion  Eucerin Dry Skin Therapy Plus Alpha Hydroxy Crme  Eucerin Dry Skin Therapy Plus Alpha Hydroxy Lotion  Eucerin Original Crme  Eucerin Original Lotion  Eucerin Plus Crme Eucerin Plus Lotion  Eucerin TriLipid Replenishing Lotion  Keri Anti-Bacterial Hand Lotion  Keri Deep Conditioning Original Lotion Dry Skin Formula Softly Scented  Keri Deep Conditioning Original Lotion, Fragrance Free Sensitive Skin Formula  Keri Lotion Fast Absorbing Fragrance Free Sensitive Skin Formula  Keri Lotion Fast Absorbing Softly Scented Dry Skin Formula  Keri Original Lotion  Keri Skin Renewal Lotion Keri Silky Smooth Lotion  Keri Silky Smooth Sensitive Skin Lotion  Nivea Body Creamy Conditioning Oil  Nivea Body Extra Enriched Lotion  Nivea Body Original Lotion  Nivea Body Sheer Moisturizing Lotion Nivea Crme  Nivea Skin Firming Lotion  NutraDerm 30 Skin Lotion  NutraDerm Skin Lotion  NutraDerm Therapeutic Skin Cream  NutraDerm Therapeutic Skin Lotion  ProShield Protective Hand Cream  Provon moisturizing lotion  How to Use an Incentive Spirometer  An incentive spirometer is a tool that measures how well you are filling your lungs with each breath. Learning to take long, deep breaths using this tool can help you keep your lungs clear and active. This may help to reverse or lessen your chance of developing breathing (pulmonary) problems, especially infection. You may be asked to use a spirometer: After a surgery. If you have a lung problem or a history of smoking. After a long period of time when you have been unable to  move or be active. If the spirometer includes an indicator to show the highest number that you have reached, your health care provider or respiratory therapist will help you set a goal. Keep a log of your progress as told by your health care provider. What are the risks? Breathing too quickly may cause dizziness or cause you to pass out. Take your time so you do not get dizzy or light-headed. If you are in pain, you may need to take pain medicine before doing incentive spirometry. It is harder to take a deep breath if you are having pain. How to use your incentive spirometer  Sit up on the edge of your  bed or on a chair. Hold the incentive spirometer so that it is in an upright position. Before you use the spirometer, breathe out normally. Place the mouthpiece in your mouth. Make sure your lips are closed tightly around it. Breathe in slowly and as deeply as you can through your mouth, causing the piston or the ball to rise toward the top of the chamber. Hold your breath for 3-5 seconds, or for as long as possible. If the spirometer includes a coach indicator, use this to guide you in breathing. Slow down your breathing if the indicator goes above the marked areas. Remove the mouthpiece from your mouth and breathe out normally. The piston or ball will return to the bottom of the chamber. Rest for a few seconds, then repeat the steps 10 or more times. Take your time and take a few normal breaths between deep breaths so that you do not get dizzy or light-headed. Do this every 1-2 hours when you are awake. If the spirometer includes a goal marker to show the highest number you have reached (best effort), use this as a goal to work toward during each repetition. After each set of 10 deep breaths, cough a few times. This will help to make sure that your lungs are clear. If you have an incision on your chest or abdomen from surgery, place a pillow or a rolled-up towel firmly against the incision when  you cough. This can help to reduce pain while taking deep breaths and coughing. General tips When you are able to get out of bed: Walk around often. Continue to take deep breaths and cough in order to clear your lungs. Keep using the incentive spirometer until your health care provider says it is okay to stop using it. If you have been in the hospital, you may be told to keep using the spirometer at home. Contact a health care provider if: You are having difficulty using the spirometer. You have trouble using the spirometer as often as instructed. Your pain medicine is not giving enough relief for you to use the spirometer as told. You have a fever. Get help right away if: You develop shortness of breath. You develop a cough with bloody mucus from the lungs. You have fluid or blood coming from an incision site after you cough. Summary An incentive spirometer is a tool that can help you learn to take long, deep breaths to keep your lungs clear and active. You may be asked to use a spirometer after a surgery, if you have a lung problem or a history of smoking, or if you have been inactive for a long period of time. Use your incentive spirometer as instructed every 1-2 hours while you are awake. If you have an incision on your chest or abdomen, place a pillow or a rolled-up towel firmly against your incision when you cough. This will help to reduce pain. Get help right away if you have shortness of breath, you cough up bloody mucus, or blood comes from your incision when you cough. This information is not intended to replace advice given to you by your health care provider. Make sure you discuss any questions you have with your health care provider.   Please go to the following website to access important education materials concerning your upcoming joint replacement.  [email or web address]

## 2024-04-21 ENCOUNTER — Encounter
Admission: RE | Disposition: A | Discharge status: Home / Self Care | Payer: Self-pay | Source: Ambulatory Visit | Attending: Surgery

## 2024-04-21 ENCOUNTER — Ambulatory Visit: Payer: Self-pay | Admitting: Urgent Care

## 2024-04-21 ENCOUNTER — Ambulatory Visit: Payer: Self-pay

## 2024-04-21 ENCOUNTER — Ambulatory Visit
Admission: RE | Admit: 2024-04-21 | Discharge: 2024-04-22 | Disposition: A | Discharge status: Home / Self Care | Source: Ambulatory Visit | Attending: Surgery | Admitting: Surgery

## 2024-04-21 ENCOUNTER — Ambulatory Visit

## 2024-04-21 ENCOUNTER — Other Ambulatory Visit: Payer: Self-pay

## 2024-04-21 ENCOUNTER — Encounter: Payer: Self-pay | Admitting: Surgery

## 2024-04-21 DIAGNOSIS — M170 Bilateral primary osteoarthritis of knee: Secondary | ICD-10-CM | POA: Insufficient documentation

## 2024-04-21 DIAGNOSIS — Z6841 Body Mass Index (BMI) 40.0 and over, adult: Secondary | ICD-10-CM | POA: Diagnosis not present

## 2024-04-21 DIAGNOSIS — E66813 Obesity, class 3: Secondary | ICD-10-CM | POA: Insufficient documentation

## 2024-04-21 DIAGNOSIS — Z79899 Other long term (current) drug therapy: Secondary | ICD-10-CM | POA: Insufficient documentation

## 2024-04-21 DIAGNOSIS — I10 Essential (primary) hypertension: Secondary | ICD-10-CM | POA: Diagnosis not present

## 2024-04-21 DIAGNOSIS — Z96651 Presence of right artificial knee joint: Secondary | ICD-10-CM

## 2024-04-21 DIAGNOSIS — F17210 Nicotine dependence, cigarettes, uncomplicated: Secondary | ICD-10-CM | POA: Insufficient documentation

## 2024-04-21 HISTORY — PX: TOTAL KNEE ARTHROPLASTY: SHX125

## 2024-04-21 SURGERY — ARTHROPLASTY, KNEE, TOTAL
Anesthesia: Spinal | Site: Knee | Laterality: Right

## 2024-04-21 MED ORDER — BUPIVACAINE HCL (PF) 0.5 % IJ SOLN
INTRAMUSCULAR | Status: AC
  Filled 2024-04-21: qty 10

## 2024-04-21 MED ORDER — HYDROMORPHONE HCL 1 MG/ML IJ SOLN: 0.2500 mg | INTRAMUSCULAR | Status: DC | PRN

## 2024-04-21 MED ORDER — HYDROCHLOROTHIAZIDE 25 MG PO TABS
25.0000 mg | ORAL_TABLET | Freq: Every day | ORAL | Status: DC
  Administered 2024-04-21 – 2024-04-22 (×2): 25 mg via ORAL
  Filled 2024-04-21 (×2): qty 1

## 2024-04-21 MED ORDER — FENTANYL CITRATE (PF) 100 MCG/2ML IJ SOLN: 25.0000 ug | INTRAMUSCULAR | Status: DC | PRN

## 2024-04-21 MED ORDER — CEFAZOLIN SODIUM-DEXTROSE 2-4 GM/100ML-% IV SOLN
INTRAVENOUS | Status: AC
  Filled 2024-04-21: qty 100

## 2024-04-21 MED ORDER — LOSARTAN POTASSIUM 50 MG PO TABS
100.0000 mg | ORAL_TABLET | Freq: Every day | ORAL | Status: DC
  Administered 2024-04-21 – 2024-04-22 (×2): 100 mg via ORAL
  Filled 2024-04-21 (×2): qty 2

## 2024-04-21 MED ORDER — MIDAZOLAM HCL 2 MG/2ML IJ SOLN
INTRAMUSCULAR | Status: AC
Start: 2024-04-21 — End: 2024-04-21
  Filled 2024-04-21: qty 2

## 2024-04-21 MED ORDER — KETOROLAC TROMETHAMINE 15 MG/ML IJ SOLN
15.0000 mg | Freq: Once | INTRAMUSCULAR | Status: AC
  Administered 2024-04-21: 15 mg via INTRAVENOUS

## 2024-04-21 MED ORDER — LOSARTAN POTASSIUM-HCTZ 100-25 MG PO TABS: 1.0000 | ORAL_TABLET | Freq: Every day | ORAL | Status: DC

## 2024-04-21 MED ORDER — SODIUM CHLORIDE 0.9 % IV SOLN: 250.0000 mL | INTRAVENOUS | Status: AC

## 2024-04-21 MED ORDER — GLYCOPYRROLATE 0.2 MG/ML IJ SOLN
INTRAMUSCULAR | Status: DC | PRN
  Administered 2024-04-21: .2 mg via INTRAVENOUS

## 2024-04-21 MED ORDER — ONDANSETRON HCL 4 MG/2ML IJ SOLN
INTRAMUSCULAR | Status: DC | PRN
  Administered 2024-04-21: 4 mg via INTRAVENOUS

## 2024-04-21 MED ORDER — KETOROLAC TROMETHAMINE 15 MG/ML IJ SOLN
INTRAMUSCULAR | Status: AC
  Filled 2024-04-21: qty 1

## 2024-04-21 MED ORDER — CEFAZOLIN SODIUM-DEXTROSE 3-4 GM/150ML-% IV SOLN
3.0000 g | Freq: Four times a day (QID) | INTRAVENOUS | Status: DC
  Administered 2024-04-21: 3 g via INTRAVENOUS
  Filled 2024-04-21 (×2): qty 150

## 2024-04-21 MED ORDER — PHENYLEPHRINE HCL-NACL 20-0.9 MG/250ML-% IV SOLN
INTRAVENOUS | Status: DC | PRN
  Administered 2024-04-21: 30 ug/min via INTRAVENOUS

## 2024-04-21 MED ORDER — DEXAMETHASONE SODIUM PHOSPHATE 10 MG/ML IJ SOLN
INTRAMUSCULAR | Status: AC
  Filled 2024-04-21: qty 1

## 2024-04-21 MED ORDER — PHENYLEPHRINE HCL-NACL 20-0.9 MG/250ML-% IV SOLN
40.0000 ug/min | INTRAVENOUS | Status: DC
  Administered 2024-04-21: 40 ug/min via INTRAVENOUS
  Filled 2024-04-21: qty 250

## 2024-04-21 MED ORDER — DOCUSATE SODIUM 100 MG PO CAPS
100.0000 mg | ORAL_CAPSULE | Freq: Two times a day (BID) | ORAL | Status: DC
  Administered 2024-04-21 – 2024-04-22 (×3): 100 mg via ORAL
  Filled 2024-04-21 (×3): qty 1

## 2024-04-21 MED ORDER — TRIAMCINOLONE ACETONIDE 40 MG/ML IJ SUSP
INTRAMUSCULAR | Status: DC | PRN
  Administered 2024-04-21: 80 mg via INTRAMUSCULAR

## 2024-04-21 MED ORDER — KETOROLAC TROMETHAMINE 0.5 % OP SOLN
1.0000 [drp] | Freq: Four times a day (QID) | OPHTHALMIC | Status: DC
  Filled 2024-04-21: qty 3

## 2024-04-21 MED ORDER — CHLORHEXIDINE GLUCONATE 0.12 % MT SOLN
15.0000 mL | Freq: Once | OROMUCOSAL | Status: AC
  Administered 2024-04-21: 15 mL via OROMUCOSAL

## 2024-04-21 MED ORDER — PHENYLEPHRINE HCL-NACL 20-0.9 MG/250ML-% IV SOLN
INTRAVENOUS | Status: AC
  Filled 2024-04-21: qty 250

## 2024-04-21 MED ORDER — TRIAMCINOLONE ACETONIDE 40 MG/ML IJ SUSP
INTRAMUSCULAR | Status: AC
  Filled 2024-04-21: qty 2

## 2024-04-21 MED ORDER — METOCLOPRAMIDE HCL 5 MG/ML IJ SOLN: 5.0000 mg | Freq: Three times a day (TID) | INTRAMUSCULAR | Status: DC | PRN

## 2024-04-21 MED ORDER — TRANEXAMIC ACID-NACL 1000-0.7 MG/100ML-% IV SOLN
INTRAVENOUS | Status: AC
  Filled 2024-04-21: qty 100

## 2024-04-21 MED ORDER — BSS IO SOLN: 15.0000 mL | Freq: Once | INTRAOCULAR | Status: DC

## 2024-04-21 MED ORDER — PHENYLEPHRINE 80 MCG/ML (10ML) SYRINGE FOR IV PUSH (FOR BLOOD PRESSURE SUPPORT)
PREFILLED_SYRINGE | INTRAVENOUS | Status: DC | PRN
  Administered 2024-04-21: 160 ug via INTRAVENOUS
  Administered 2024-04-21 (×2): 80 ug via INTRAVENOUS
  Administered 2024-04-21: 160 ug via INTRAVENOUS

## 2024-04-21 MED ORDER — SODIUM CHLORIDE 0.9 % IV SOLN: INTRAVENOUS | Status: AC

## 2024-04-21 MED ORDER — BUPIVACAINE-EPINEPHRINE (PF) 0.5% -1:200000 IJ SOLN
INTRAMUSCULAR | Status: DC | PRN
Start: 2024-04-21 — End: 2024-04-21
  Administered 2024-04-21: 30 mL

## 2024-04-21 MED ORDER — ONDANSETRON HCL 4 MG/2ML IJ SOLN: 4.0000 mg | Freq: Four times a day (QID) | INTRAMUSCULAR | Status: DC | PRN

## 2024-04-21 MED ORDER — MIDAZOLAM HCL 5 MG/5ML IJ SOLN
INTRAMUSCULAR | Status: DC | PRN
  Administered 2024-04-21 (×2): 1 mg via INTRAVENOUS

## 2024-04-21 MED ORDER — ACETAMINOPHEN 500 MG PO TABS
1000.0000 mg | ORAL_TABLET | Freq: Four times a day (QID) | ORAL | Status: AC
  Administered 2024-04-21 – 2024-04-22 (×3): 1000 mg via ORAL
  Filled 2024-04-21 (×4): qty 2

## 2024-04-21 MED ORDER — APIXABAN 2.5 MG PO TABS
2.5000 mg | ORAL_TABLET | Freq: Two times a day (BID) | ORAL | Status: DC
  Administered 2024-04-22: 2.5 mg via ORAL
  Filled 2024-04-21: qty 1

## 2024-04-21 MED ORDER — CEFAZOLIN SODIUM-DEXTROSE 3-4 GM/150ML-% IV SOLN
3.0000 g | Freq: Four times a day (QID) | INTRAVENOUS | Status: AC
  Administered 2024-04-21: 3 g via INTRAVENOUS
  Filled 2024-04-21: qty 150

## 2024-04-21 MED ORDER — ORAL CARE MOUTH RINSE: 15.0000 mL | Freq: Once | OROMUCOSAL | Status: AC

## 2024-04-21 MED ORDER — ONDANSETRON HCL 4 MG/2ML IJ SOLN
INTRAMUSCULAR | Status: AC
  Filled 2024-04-21: qty 2

## 2024-04-21 MED ORDER — KETOROLAC TROMETHAMINE 0.5 % OP SOLN
1.0000 [drp] | Freq: Four times a day (QID) | OPHTHALMIC | Status: DC
  Administered 2024-04-21 (×2): 1 [drp] via OPHTHALMIC
  Filled 2024-04-21: qty 3

## 2024-04-21 MED ORDER — PROPOFOL 1000 MG/100ML IV EMUL
INTRAVENOUS | Status: AC
  Filled 2024-04-21: qty 100

## 2024-04-21 MED ORDER — ONDANSETRON HCL 4 MG PO TABS: 4.0000 mg | ORAL_TABLET | Freq: Four times a day (QID) | ORAL | Status: DC | PRN

## 2024-04-21 MED ORDER — FENTANYL CITRATE (PF) 100 MCG/2ML IJ SOLN
INTRAMUSCULAR | Status: AC
  Filled 2024-04-21: qty 2

## 2024-04-21 MED ORDER — FENTANYL CITRATE (PF) 100 MCG/2ML IJ SOLN
INTRAMUSCULAR | Status: DC | PRN
  Administered 2024-04-21 (×2): 50 ug via INTRAVENOUS

## 2024-04-21 MED ORDER — KETOROLAC TROMETHAMINE 15 MG/ML IJ SOLN
7.5000 mg | Freq: Four times a day (QID) | INTRAMUSCULAR | Status: DC
  Administered 2024-04-21 – 2024-04-22 (×3): 7.5 mg via INTRAVENOUS
  Filled 2024-04-21 (×3): qty 1

## 2024-04-21 MED ORDER — CHLORHEXIDINE GLUCONATE 0.12 % MT SOLN
OROMUCOSAL | Status: AC
  Filled 2024-04-21: qty 15

## 2024-04-21 MED ORDER — BISACODYL 10 MG RE SUPP: 10.0000 mg | Freq: Every day | RECTAL | Status: DC | PRN

## 2024-04-21 MED ORDER — TOPIRAMATE 25 MG PO TABS
25.0000 mg | ORAL_TABLET | Freq: Every day | ORAL | Status: DC
  Administered 2024-04-22: 25 mg via ORAL
  Filled 2024-04-21: qty 1

## 2024-04-21 MED ORDER — KETOROLAC TROMETHAMINE 30 MG/ML IJ SOLN
INTRAMUSCULAR | Status: DC | PRN
  Administered 2024-04-21: 30 mg via INTRAMUSCULAR

## 2024-04-21 MED ORDER — SODIUM CHLORIDE (PF) 0.9 % IJ SOLN
INTRAMUSCULAR | Status: AC
  Filled 2024-04-21: qty 40

## 2024-04-21 MED ORDER — OXYCODONE HCL 5 MG PO TABS
5.0000 mg | ORAL_TABLET | ORAL | Status: DC | PRN
  Administered 2024-04-21 (×2): 10 mg via ORAL
  Filled 2024-04-21 (×2): qty 2

## 2024-04-21 MED ORDER — BUPIVACAINE HCL (PF) 0.5 % IJ SOLN
INTRAMUSCULAR | Status: DC | PRN
  Administered 2024-04-21: 3 mL

## 2024-04-21 MED ORDER — TRANEXAMIC ACID-NACL 1000-0.7 MG/100ML-% IV SOLN
1000.0000 mg | INTRAVENOUS | Status: AC
  Administered 2024-04-21: 1000 mg via INTRAVENOUS

## 2024-04-21 MED ORDER — DROPERIDOL 2.5 MG/ML IJ SOLN: 0.6250 mg | Freq: Once | INTRAMUSCULAR | Status: DC | PRN

## 2024-04-21 MED ORDER — PROPOFOL 10 MG/ML IV BOLUS
INTRAVENOUS | Status: AC
  Filled 2024-04-21: qty 20

## 2024-04-21 MED ORDER — PROPOFOL 500 MG/50ML IV EMUL
INTRAVENOUS | Status: DC | PRN
Start: 2024-04-21 — End: 2024-04-21
  Administered 2024-04-21: 125 ug/kg/min via INTRAVENOUS

## 2024-04-21 MED ORDER — MAGNESIUM HYDROXIDE 400 MG/5ML PO SUSP
30.0000 mL | Freq: Every day | ORAL | Status: DC | PRN
Start: 2024-04-21 — End: 2024-04-22

## 2024-04-21 MED ORDER — PHENTERMINE HCL 37.5 MG PO TABS: 37.5000 mg | ORAL_TABLET | Freq: Every day | ORAL | Status: DC

## 2024-04-21 MED ORDER — ACETAMINOPHEN 325 MG PO TABS: 325.0000 mg | ORAL_TABLET | Freq: Four times a day (QID) | ORAL | Status: DC | PRN

## 2024-04-21 MED ORDER — SEVOFLURANE IN SOLN
RESPIRATORY_TRACT | Status: AC
  Filled 2024-04-21: qty 250

## 2024-04-21 MED ORDER — BUPIVACAINE LIPOSOME 1.3 % IJ SUSP
INTRAMUSCULAR | Status: AC
Start: 2024-04-21 — End: 2024-04-21
  Filled 2024-04-21: qty 20

## 2024-04-21 MED ORDER — FLEET ENEMA RE ENEM: 1.0000 | ENEMA | Freq: Once | RECTAL | Status: DC | PRN

## 2024-04-21 MED ORDER — LACTATED RINGERS IV SOLN: INTRAVENOUS | Status: DC

## 2024-04-21 MED ORDER — DEXAMETHASONE SODIUM PHOSPHATE 4 MG/ML IJ SOLN
INTRAMUSCULAR | Status: DC | PRN
  Administered 2024-04-21: 10 mg via INTRAVENOUS

## 2024-04-21 MED ORDER — ACETAMINOPHEN 10 MG/ML IV SOLN
INTRAVENOUS | Status: DC | PRN
  Administered 2024-04-21: 1000 mg via INTRAVENOUS

## 2024-04-21 MED ORDER — METOCLOPRAMIDE HCL 10 MG PO TABS: 5.0000 mg | ORAL_TABLET | Freq: Three times a day (TID) | ORAL | Status: DC | PRN

## 2024-04-21 MED ORDER — VASOPRESSIN 20 UNIT/ML IV SOLN
INTRAVENOUS | Status: DC | PRN
  Administered 2024-04-21 (×2): 1 [IU] via INTRAVENOUS

## 2024-04-21 MED ORDER — SODIUM CHLORIDE 0.9 % IV SOLN
INTRAVENOUS | Status: DC | PRN
  Administered 2024-04-21: 60 mL

## 2024-04-21 MED ORDER — SODIUM CHLORIDE 0.9 % IR SOLN
Status: DC | PRN
Start: 2024-04-21 — End: 2024-04-21
  Administered 2024-04-21: 3000 mL

## 2024-04-21 MED ORDER — KETOROLAC TROMETHAMINE 0.5 % OP SOLN: 1.0000 [drp] | Freq: Four times a day (QID) | OPHTHALMIC | Status: DC | PRN

## 2024-04-21 MED ORDER — BUPIVACAINE-EPINEPHRINE (PF) 0.5% -1:200000 IJ SOLN
INTRAMUSCULAR | Status: AC
  Filled 2024-04-21: qty 30

## 2024-04-21 MED ORDER — CEFAZOLIN SODIUM-DEXTROSE 2-4 GM/100ML-% IV SOLN
2.0000 g | INTRAVENOUS | Status: AC
  Administered 2024-04-21: 2 g via INTRAVENOUS
  Administered 2024-04-21: 1 g via INTRAVENOUS

## 2024-04-21 MED ORDER — DIPHENHYDRAMINE HCL 12.5 MG/5ML PO ELIX: 12.5000 mg | ORAL_SOLUTION | ORAL | Status: DC | PRN

## 2024-04-21 MED ORDER — ACETAMINOPHEN 10 MG/ML IV SOLN
INTRAVENOUS | Status: AC
  Filled 2024-04-21: qty 100

## 2024-04-21 SURGICAL SUPPLY — 54 items
BLADE SAW 90X13X1.19 OSCILLAT (BLADE) ×1 IMPLANT
BLADE SAW SAG 25X90X1.19 (BLADE) ×1 IMPLANT
BLADE SURG SZ20 CARB STEEL (BLADE) ×1 IMPLANT
BNDG COMPR 6X5.8 VLCR NS LF (GAUZE/BANDAGES/DRESSINGS) ×1 IMPLANT
CEMENT BONE R 1X40 (Cement) ×2 IMPLANT
CEMENT VACUUM MIXING SYSTEM (MISCELLANEOUS) ×1 IMPLANT
CHLORAPREP W/TINT 26 (MISCELLANEOUS) ×1 IMPLANT
COMPONENT FEM CMT PRSONA SZ7RT (Joint) IMPLANT
COMPONET TIB PS KNEE E 0D RT (Joint) IMPLANT
COOLER ICEMAN CLASSIC (MISCELLANEOUS) ×1 IMPLANT
COVER MAYO STAND STRL (DRAPES) ×1 IMPLANT
CUFF TRNQT CYL 24X4X16.5-23 (TOURNIQUET CUFF) IMPLANT
CUFF TRNQT CYL 34X4.125X (TOURNIQUET CUFF) IMPLANT
DRAPE IMP U-DRAPE 54X76 (DRAPES) ×1 IMPLANT
DRAPE SHEET LG 3/4 BI-LAMINATE (DRAPES) ×1 IMPLANT
DRAPE U-SHAPE 47X51 STRL (DRAPES) ×1 IMPLANT
DRSG MEPILEX SACRM 8.7X9.8 (GAUZE/BANDAGES/DRESSINGS) IMPLANT
DRSG OPSITE POSTOP 4X10 (GAUZE/BANDAGES/DRESSINGS) ×1 IMPLANT
DRSG OPSITE POSTOP 4X8 (GAUZE/BANDAGES/DRESSINGS) ×1 IMPLANT
ELECT CAUTERY BLADE 6.4 (BLADE) ×1 IMPLANT
ELECTRODE REM PT RTRN 9FT ADLT (ELECTROSURGICAL) ×1 IMPLANT
GAUZE XEROFORM 1X8 LF (GAUZE/BANDAGES/DRESSINGS) ×1 IMPLANT
GLOVE BIO SURGEON STRL SZ7.5 (GLOVE) ×4 IMPLANT
GLOVE BIO SURGEON STRL SZ8 (GLOVE) ×4 IMPLANT
GLOVE BIOGEL PI IND STRL 8 (GLOVE) ×1 IMPLANT
GLOVE INDICATOR 8.0 STRL GRN (GLOVE) ×1 IMPLANT
GOWN STRL REUS W/ TWL LRG LVL3 (GOWN DISPOSABLE) ×1 IMPLANT
GOWN STRL REUS W/ TWL XL LVL3 (GOWN DISPOSABLE) ×1 IMPLANT
HOOD PEEL AWAY T7 (MISCELLANEOUS) ×3 IMPLANT
INSERT TIB ASF SZ 6-7/EF 10 RT (Insert) IMPLANT
KIT TURNOVER KIT A (KITS) ×1 IMPLANT
MANIFOLD NEPTUNE II (INSTRUMENTS) ×1 IMPLANT
NDL SPNL 20GX3.5 QUINCKE YW (NEEDLE) ×1 IMPLANT
NEEDLE SPNL 20GX3.5 QUINCKE YW (NEEDLE) ×1 IMPLANT
NS IRRIG 500ML POUR BTL (IV SOLUTION) ×1 IMPLANT
PACK TOTAL KNEE (MISCELLANEOUS) ×1 IMPLANT
PAD ARMBOARD POSITIONER FOAM (MISCELLANEOUS) IMPLANT
PAD COLD UNI WRAP-ON (PAD) ×1 IMPLANT
PENCIL SMOKE EVACUATOR (MISCELLANEOUS) ×1 IMPLANT
PIN DRILL HDLS TROCAR 75 4PK (PIN) IMPLANT
SCREW FEMALE HEX FIX 25X2.5 (ORTHOPEDIC DISPOSABLE SUPPLIES) IMPLANT
SOL .9 NS 3000ML IRR UROMATIC (IV SOLUTION) ×1 IMPLANT
STAPLER SKIN PROX 35W (STAPLE) ×1 IMPLANT
STEM POLY PAT PLY 32M KNEE (Knees) IMPLANT
STOCKINETTE IMPERV 14X48 (MISCELLANEOUS) ×1 IMPLANT
SUCTION TUBE FRAZIER 10FR DISP (SUCTIONS) ×1 IMPLANT
SUT VIC AB 0 CT1 36 (SUTURE) ×3 IMPLANT
SUT VIC AB 2-0 CT1 TAPERPNT 27 (SUTURE) ×3 IMPLANT
SYR 10ML LL (SYRINGE) ×1 IMPLANT
SYR 20ML LL LF (SYRINGE) ×1 IMPLANT
SYR 30ML LL (SYRINGE) IMPLANT
TIP FAN IRRIG PULSAVAC PLUS (DISPOSABLE) ×1 IMPLANT
TRAP FLUID SMOKE EVACUATOR (MISCELLANEOUS) ×1 IMPLANT
WATER STERILE IRR 500ML POUR (IV SOLUTION) ×1 IMPLANT

## 2024-04-21 NOTE — Plan of Care (Signed)
  Problem: Pain Managment: Goal: General experience of comfort will improve and/or be controlled Outcome: Progressing   Problem: Safety: Goal: Ability to remain free from injury will improve Outcome: Progressing

## 2024-04-21 NOTE — Op Note (Signed)
 04/21/2024  9:54 AM  Patient:   Brandi Wiggins  Pre-Op Diagnosis:   Degenerative joint disease, right knee.  Post-Op Diagnosis:   Same  Procedure:   Right TKA using all-cemented Zimmer Persona system with a #7 PCR femur, a(n) E-sized  tibial tray with a 10 mm medial congruent E-poly insert, and an 8.5 x 32 mm all-poly 3-pegged domed patella.  Surgeon:   DOROTHA Reyes Maltos, MD  Assistant:   Gustavo Level, PA-C   Anesthesia:   Spinal  Findings:   As above  Complications:   None  EBL:   10 cc  Fluids:   800 cc crystalloid  UOP:   None  TT:   100 minutes at 300 mmHg  Drains:   None  Closure:   Staples  Implants:   As above  Brief Clinical Note:   The patient is a 68 year old female with a long history of progressively worsening right knee pain. The patient's symptoms have progressed despite medications, activity modification, injections, etc. The patient's history and examination were consistent with advanced degenerative joint disease of the right knee confirmed by plain radiographs. The patient presents at this time for a right total knee arthroplasty.  Procedure:   The patient was brought into the operating room. After adequate spinal anesthesia was obtained, the patient was repositioned in the supine position on the operating room table. The right lower extremity was prepped with ChloraPrep solution and draped sterilely. Preoperative antibiotics were administered. A timeout was performed to verify the appropriate surgical site before the limb was exsanguinated with an Esmarch and the tourniquet inflated to 300 mmHg.   A standard anterior approach to the knee was made through an approximately 6-7 inch incision. The incision was carried down through the subcutaneous tissues to expose superficial retinaculum. This was split the length of the incision and the medial flap elevated sufficiently to expose the medial retinaculum. The medial retinaculum was incised, leaving a 3-4 mm cuff  of tissue on the patella. This was extended distally along the medial border of the patellar tendon and proximally through the medial third of the quadriceps tendon. A subtotal fat pad excision was performed before the soft tissues were elevated off the anteromedial and anterolateral aspects of the proximal tibia to the level of the collateral ligaments. The anterior portions of the medial and lateral menisci were removed, as was the anterior cruciate ligament. With the knee flexed to 90, the external tibial guide was positioned and the appropriate proximal tibial cut made. This piece was taken to the back table where it was measured and found to be optimally replicated by a(n) E-sized component.  Attention was directed to the distal femur. The intramedullary canal was accessed through a 3/8 drill hole. The intramedullary guide was inserted and placed at 5 of valgus alignment. Using the +0 slot, the distal cut was made. The distal femur was measured and found to be optimally replicated by the #7 component. The #7 4-in-1 cutting block was positioned and first the posterior, then the posterior chamfer, the anterior, and finally the anterior chamfer cuts were made after verifying that the anterior cortex would not be notched.   At this point, the posterior portions medial and lateral menisci were removed. A trial reduction was performed using the appropriate femoral and tibial components with the 10 mm insert. This demonstrated excellent stability to varus and valgus stressing both in flexion and extension while permitting full extension. Patellar tracking was assessed and found to be excellent.  Therefore, the tibial trial position was marked on the proximal tibia. The patella thickness was measured and found to be 20 mm. Therefore, the appropriate cut was made. The patellar surface was measured and found to be optimally replicated by the 32 mm component. The three peg holes were drilled in place before the  trial button was inserted. Patella tracking was assessed and found to be excellent, passing the no thumb test. The lug holes were drilled into the distal femur before the trial component was removed.  The tibial tray was repositioned before the keel was created using the appropriate tower, reamer, and punch.  The bony surfaces were prepared for cementing by irrigating them thoroughly with sterile saline solution via the jet lavage system. A bone plug was fashioned from some of the bone that had been removed previously and used to plug the distal femoral canal. In addition, a cocktail of 20 cc of Exparel , 30 cc of 0.5% Sensorcaine , 2 cc of Kenalog  40 (80 mg), and 30 mg of Toradol  diluted out to 90 cc with normal saline was injected into the postero-medial and postero-lateral aspects of the knee, the medial and lateral gutter regions, and the peri-incisional tissues to help with postoperative analgesia. Meanwhile, the cement was being mixed on the back table.   When the cement was ready, the tibial tray was cemented in first. The excess cement was removed using Personal assistant. Next, the femoral component was impacted into place. Again, the excess cement was removed using Personal assistant. The 10 mm trial insert was positioned and the knee brought into extension while the cement hardened. Finally, the patella was cemented into place and secured using the patellar clamp. Again, the excess cement was removed using Personal assistant. Once the cement had hardened, the knee was placed through a range of motion with the findings as described above. Therefore, the trial insert was removed and, after verifying that no cement had been retained posteriorly, the permanent 10 mm medial congruent E-polyethylene insert was snapped into place with care taken to ensure appropriate locking of the insert. Again the knee was placed through a range of motion with the findings as described above.  The wound was copiously irrigated  with sterile saline solution using the jet lavage system before the quadriceps tendon and retinacular layer were reapproximated using #0 Vicryl interrupted sutures. The superficial retinacular layer also was closed using a running #0 Vicryl suture. The subcutaneous tissues were closed in several layers using 2-0 Vicryl interrupted sutures. The skin was closed using staples. A sterile honeycomb dressing was applied to the skin before the leg was wrapped with an Ace wrap to accommodate the Polar Care device. The patient was then awakened and returned to the recovery room in satisfactory condition after tolerating the procedure well.

## 2024-04-21 NOTE — TOC CM/SW Note (Signed)
 Patient is not able to walk the distance required to go the bathroom, or he/she is unable to safely negotiate stairs required to access the bathroom.  A 3in1 BSC will alleviate this problem

## 2024-04-21 NOTE — Anesthesia Preprocedure Evaluation (Signed)
 Anesthesia Evaluation  Patient identified by MRN, date of birth, ID band Patient awake    Reviewed: Allergy & Precautions, NPO status , Patient's Chart, lab work & pertinent test results  History of Anesthesia Complications Negative for: history of anesthetic complications  Airway Mallampati: II  TM Distance: >3 FB Neck ROM: Full    Dental  (+) Partial Lower, Partial Upper   Pulmonary neg shortness of breath, neg sleep apnea, neg COPD, neg recent URI, Current Smoker and Patient abstained from smoking.   Pulmonary exam normal breath sounds clear to auscultation       Cardiovascular Exercise Tolerance: Good METShypertension, Pt. on medications (-) angina (-) CAD and (-) Past MI (-) dysrhythmias  Rhythm:Regular Rate:Normal - Systolic murmurs    Neuro/Psych negative neurological ROS  negative psych ROS   GI/Hepatic Neg liver ROS,neg GERD  ,,(+)     (-) substance abuse    Endo/Other  neg diabetes  Class 3 obesity  Renal/GU negative Renal ROS     Musculoskeletal   Abdominal   Peds  Hematology   Anesthesia Other Findings Past Medical History: No date: Hypertension  Reproductive/Obstetrics                              Anesthesia Physical Anesthesia Plan  ASA: 3  Anesthesia Plan: Spinal   Post-op Pain Management:    Induction: Intravenous  PONV Risk Score and Plan: 2 and Propofol  infusion, TIVA and Treatment may vary due to age or medical condition  Airway Management Planned: Natural Airway and Simple Face Mask  Additional Equipment: None  Intra-op Plan:   Post-operative Plan:   Informed Consent: I have reviewed the patients History and Physical, chart, labs and discussed the procedure including the risks, benefits and alternatives for the proposed anesthesia with the patient or authorized representative who has indicated his/her understanding and acceptance.     Dental  advisory given  Plan Discussed with: CRNA and Surgeon  Anesthesia Plan Comments: (Discussed risks of anesthesia with patient, including PONV, sore throat, lip/dental damage. Rare risks discussed as well, such as cardiorespiratory and neurological sequelae, and allergic reactions. Patient understands. Discussed r/b/a of interscalene block, including elective nature. Risks discussed: - Rare: bleeding, infection, nerve damage - shortness of breath from hemidiaphragmatic paralysis - unilateral horner's syndrome - poor/non-working blocks Patient understands and agrees. )         Anesthesia Quick Evaluation

## 2024-04-21 NOTE — Anesthesia Procedure Notes (Signed)
 Procedure Name: General with mask airway Date/Time: 04/21/2024 7:56 AM  Performed by: Ledora Duncan, CRNAPre-anesthesia Checklist: Patient identified, Emergency Drugs available, Suction available and Patient being monitored Patient Re-evaluated:Patient Re-evaluated prior to induction Oxygen Delivery Method: Simple face mask Induction Type: IV induction Placement Confirmation: positive ETCO2 and breath sounds checked- equal and bilateral Dental Injury: Teeth and Oropharynx as per pre-operative assessment

## 2024-04-21 NOTE — Transfer of Care (Signed)
 Immediate Anesthesia Transfer of Care Note  Patient: Brandi Wiggins  Procedure(s) Performed: ARTHROPLASTY, KNEE, TOTAL (Right: Knee)  Patient Location: PACU  Anesthesia Type:Spinal  Level of Consciousness: drowsy and patient cooperative  Airway & Oxygen Therapy: Patient Spontanous Breathing and Patient connected to face mask oxygen  Post-op Assessment: Report given to RN and Post -op Vital signs reviewed and stable  Post vital signs: Reviewed and stable  Last Vitals:  Vitals Value Taken Time  BP 145/94 04/21/24 11:15  Temp 37.2 C 04/21/24 10:07  Pulse 79 04/21/24 11:21  Resp 13 04/21/24 11:21  SpO2 99 % 04/21/24 11:21  Vitals shown include unfiled device data.  Last Pain:  Vitals:   04/21/24 1100  TempSrc:   PainSc: 0-No pain         Complications: No notable events documented.

## 2024-04-21 NOTE — H&P (Signed)
 History of Present Illness: Brandi Wiggins is a 68 y.o.female who is here for history and physical for an upcoming right total knee arthroplasty to be done by Dr. Edie on April 21, 2024. The symptoms began several years ago and develop without any specific cause or injury. She saw Dr. Edie in July where x-rays demonstrated advanced degenerative joint disease of both knees, right worse than left. The patient was started on meloxicam and referred to me for further evaluation and treatment. She reports 4/10 pain. The pain is located along the anterior and lateral aspects of the knee. The pain is described as aching, dull, stabbing, and throbbing. The symptoms are aggravated using stairs, at higher levels of activity, rising from a chair, walking, standing, and activity in general. She is ambulating without any assistive devices, but is unable to reciprocate stairs. She also describes occasional catching and feeling as though her knee wants to give way on her. She has associated mild swelling and deformity. She has tried acetaminophen , over-the-counter medications, anti-inflammatories, narcotic medications, and heat with limited benefit. Apparently, she also is working on weight loss with her primary care provider. The patient is not a diabetic.  Current Outpatient Medications  losartan -hydrochlorothiazide  (HYZAAR) 100-25 mg tablet Take 1 tablet by mouth once daily for Blood Pressure  aspirin  325 MG tablet Take by mouth (Patient not taking: Reported on 04/15/2024)  ibuprofen (ADVIL,MOTRIN) 200 MG tablet Take 800 mg by mouth every 6 (six) hours as needed for Pain. (Patient not taking: Reported on 04/15/2024)  meloxicam (MOBIC) 15 MG tablet TAKE 1 TABLET BY MOUTH EVERY DAY (Patient not taking: Reported on 04/15/2024) 30 tablet 1   Allergies: No Known Allergies  Past Medical History:  History of head injury (She had a head injury as a 13-year-old that sounds like a subdural hematoma with burr holes to relieve  the pressure; no long-term damage from this)  Hypertension  Osteoarthritis   Past Surgical History:  Arthroscopic SLAP repair, arthroscopic debridement of grade 3 chondromalacial changes of the glenoid, arthroscopic subacromial decompression, and mini-open repair of rotator cuff tear with mini-open biceps tenodesis left shoulder Left 10/14/2014  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 Edie)  JOINT REPLACEMENT 03/2020  Reverse shoulder surgery  SUBDURAL HEMATOMA EVACUATION VIA CRANIOTOMY   Family History:  Cancer Mother  Heart disease Father [name]   Social History:   Socioeconomic History:  Marital status: Legally Separated  Tobacco Use  Smoking status: Some Days  Types: Cigarettes  Smokeless tobacco: Current  Vaping Use  Vaping status: Never Used  Substance and Sexual Activity  Alcohol use: Yes  Comment: Occasionally during events  Drug use: No  Sexual activity: Not Currently  Partners: Male   Review of Systems:  A comprehensive 14 point ROS was performed, reviewed, and the pertinent orthopaedic findings are documented in the HPI.  Physical Exam: Vitals:  04/15/24 1536  BP: 132/88  Weight: (!) 124.3 kg (274 lb)  Height: 170.2 cm (5' 7)  PainSc: 4  PainLoc: Knee   General/Constitutional: Pleasant significantly overweight middle-age female in no acute distress. Neuro/Psych: Normal mood and affect, oriented to person, place and time. Eyes: Non-icteric. Pupils are equal, round, and reactive to light, and exhibit synchronous movement. Lymphatic: No palpable adenopathy. Respiratory: Lungs clear to auscultation, Normal chest excursion, No wheezes, and Non-labored breathing Cardiovascular: Regular rate and rhythm. No murmurs. and No edema, swelling or tenderness, except as noted in detailed exam. Vascular: No edema, swelling or tenderness, except as noted in  detailed exam. Integumentary: No impressive skin lesions present, except as noted  in detailed exam. Musculoskeletal: Unremarkable, except as noted in detailed exam.  Right knee exam: GAIT: moderate limp and uses no assistive devices. ALIGNMENT: mild valgus SKIN: unremarkable SWELLING: mild EFFUSION: small WARMTH: no warmth TENDERNESS: Mild-moderate tenderness along lateral joint line, mild tenderness along medial joint line ROM: 0 to 105 degrees with pain in maximal flexion McMURRAY'S: equivocal PATELLOFEMORAL: normal tracking with no peri-patellar tenderness and negative apprehension sign CREPITUS: Mild patellofemoral crepitance LACHMAN'S: negative PIVOT SHIFT: Not evaluated ANTERIOR DRAWER: negative POSTERIOR DRAWER: negative VARUS/VALGUS: Mildly positive pseudolaxity to valgus stressing  She is neurovascularly intact to the right lower extremity and foot.  Heart: Examination of the heart reveals regular, rate, and rhythm. There is no murmur noted on ascultation. There is a normal apical pulse.  Lungs: Lungs are clear to auscultation. There is no wheeze, rhonchi, or crackles. There is normal expansion of bilateral chest walls.   Imaging: Recent AP, lateral, and Rosenberg weightbearing of both knees, as well as a Publishing copy view of the right knee are available for review and have been reviewed by myself. These films demonstrate severe degenerative changes, primarily involving the lateral compartment with 90% lateral joint space narrowing. Lesser degenerative changes of the medial and patellofemoral compartments also are noted. Overall alignment is mild valgus. No fractures, lytic lesions, or abnormal calcifications are noted.  Assessment:  1. Primary osteoarthritis of right knee.  2. Morbid obesity with BMI of 40.0-44.9, adult (CMS/HHS-HCC).   Plan: The treatment options were discussed with the patient. In addition, patient educational materials were provided regarding the diagnosis and treatment options. The patient is quite frustrated by her symptoms and  functional limitations, especially as they pertain to her right knee, and is ready to consider more aggressive treatment options. Therefore, I have recommended a surgical procedure, specifically a right total knee arthroplasty. The procedure was discussed with the patient, as were the potential risks (including bleeding, infection, nerve and/or blood vessel injury, persistent or recurrent pain, loosening and/or failure of the components, dislocation, need for further surgery, blood clots, strokes, heart attacks and/or arhythmias, pneumonia, etc.) and benefits. The patient states her understanding and wishes to proceed. All of the patient's questions and concerns were answered. She can call any time with further concerns. She will follow up post-surgery, routine.    H&P reviewed and patient re-examined. No changes.

## 2024-04-21 NOTE — Anesthesia Procedure Notes (Signed)
 Spinal  Patient location during procedure: OR Start time: 04/21/2024 7:45 AM End time: 04/21/2024 7:46 AM Reason for block: surgical anesthesia Staffing Performed: other anesthesia staff  Anesthesiologist: Dario Barter, MD Resident/CRNA: Ledora Duncan, CRNA Other anesthesia staff: Mathew Bernardino RAMAN, RN Performed by: Mathew Bernardino RAMAN, RN Authorized by: Dario Barter, MD   Preanesthetic Checklist Completed: patient identified, IV checked, site marked, risks and benefits discussed, surgical consent, monitors and equipment checked, pre-op evaluation and timeout performed Spinal Block Patient position: sitting Prep: ChloraPrep Patient monitoring: heart rate, cardiac monitor, continuous pulse ox and blood pressure Approach: midline Location: L3-4 Injection technique: single-shot Needle Needle type: Sprotte and Pencan  Needle gauge: 24 G Needle length: 10 cm Assessment Sensory level: T4 Events: CSF return

## 2024-04-21 NOTE — Evaluation (Signed)
 Physical Therapy Evaluation Patient Details Name: Brandi Wiggins MRN: [id number] DOB: Dec 16, 1955 Today's Date: 04/21/2024  History of Present Illness  Pt is a 68 y.o. female s/p R TKA d/t DJD 04/21/24.  PMH includes h/o head injury as 68 y.o., SDH evacuation via craniotomy, htn, OA, L shoulder surgery 2016, R reverse TSA 2022.  Clinical Impression  Prior to surgery, pt reports being modified independent ambulating with quad cane (limited mobility d/t R knee impairments/pain); lives alone in 1 level home with 5 STE B railings (pt's daughter planning to stay with pt for 2 weeks to assist as needed).  Pain 8/10 R knee at rest beginning of session (pt sitting on BSC upon PT arrival; nurse aware of pt's pain and brought pt pain medication); pain 9/10 R knee end of session at rest (nurse notified of pt's pain level).  Currently pt is min assist to stand from [facility] up to RW; CGA to ambulate a few feet BSC to bed with RW use (vc's to increase UE support through RW in order to off-weight R LE to improve L LE advancement); and min assist to lay back down in bed end of session.  Pt would currently benefit from skilled PT to address noted impairments and functional limitations (see below for any additional details).  Upon hospital discharge, pt would benefit from ongoing therapy.     If plan is discharge home, recommend the following: A little help with bathing/dressing/bathroom;A lot of help with walking and/or transfers;Assistance with cooking/housework;Assist for transportation;Help with stairs or ramp for entrance   Can travel by private vehicle        Equipment Recommendations Rolling walker (2 wheels);BSC/3in1  Recommendations for Other Services       Functional Status Assessment Patient has had a recent decline in their functional status and demonstrates the ability to make significant improvements in function in a reasonable and predictable amount of time.     Precautions / Restrictions  Precautions Precautions: Knee;Fall Precaution Booklet Issued: Yes (comment) Recall of Precautions/Restrictions: Intact Restrictions Weight Bearing Restrictions Per Provider Order: Yes RLE Weight Bearing Per Provider Order: Weight bearing as tolerated      Mobility  Bed Mobility Overal bed mobility: Needs Assistance Bed Mobility: Sit to Supine       Sit to supine: Min assist, HOB elevated   General bed mobility comments: assist for R LE    Transfers Overall transfer level: Needs assistance Equipment used: Rolling walker (2 wheels) Transfers: Sit to/from Stand Sit to Stand: Min assist           General transfer comment: assist to stand from [facility]; vc's for UE/LE positioning and overall technique    Ambulation/Gait Ambulation/Gait assistance: Contact guard assist Gait Distance (Feet): 3 Feet (BSC to bed) Assistive device: Rolling walker (2 wheels)   Gait velocity: decreased     General Gait Details: antalgic; decreased stance time R LE; vc's to increase UE support through RW to offweight R LE during L LE advancement; limited d/t R knee pain  Stairs            Wheelchair Mobility     Tilt Bed    Modified Rankin (Stroke Patients Only)       Balance Overall balance assessment: Needs assistance Sitting-balance support: No upper extremity supported, Feet supported Sitting balance-Leahy Scale: Good Sitting balance - Comments: steady reaching within BOS   Standing balance support: Single extremity supported Standing balance-Leahy Scale: Fair Standing balance comment: steady in standing performing self peri-care  with single UE support                             Pertinent Vitals/Pain Pain Assessment Pain Assessment: 0-10 Pain Score: 9  Pain Location: R knee Pain Descriptors / Indicators: Constant, Sharp, Shooting Pain Intervention(s): Limited activity within patient's tolerance, Monitored during session, RN gave pain meds during session,  Ice applied, Repositioned HR and SpO2 (on room air) stable during session.    Home Living Family/patient expects to be discharged to:: Private residence Living Arrangements: Alone Available Help at Discharge: Family;Available 24 hours/day Type of Home: House Home Access: Stairs to enter Entrance Stairs-Rails: Right;Left;Can reach both Entrance Stairs-Number of Steps: 5   Home Layout: One level Home Equipment: Cane - quad      Prior Function Prior Level of Function : Independent/Modified Independent             Mobility Comments: Modified independent ambulating with quad cane; no recent falls reported. ADLs Comments: (+) driving; Independent with ADL's; (+) working     Extremity/Trunk Assessment   Upper Extremity Assessment Upper Extremity Assessment: Overall WFL for tasks assessed    Lower Extremity Assessment Lower Extremity Assessment: RLE deficits/detail (L LE WFL) RLE Deficits / Details: able to perform R LE SLR independently; at least 3/5 AROM knee flexion/extension (limited knee ROM) and DF/PF RLE: Unable to fully assess due to pain    Cervical / Trunk Assessment Cervical / Trunk Assessment: Normal  Communication   Communication Communication: No apparent difficulties    Cognition Arousal: Alert Behavior During Therapy: WFL for tasks assessed/performed   PT - Cognitive impairments: No apparent impairments                         Following commands: Intact       Cueing Cueing Techniques: Verbal cues     General Comments  Nursing cleared pt for participation in physical therapy.  Pt agreeable to PT session.  Pt's daughter arrived during session.    Exercises Total Joint Exercises Long Arc Quad: AROM, Strengthening, Right, 10 reps, Seated Knee Flexion: AROM, Strengthening, Right, 10 reps, Seated Goniometric ROM: R knee AROM extension 5 degrees short of neutral and AROM R knee flexion to 78 degrees General Exercises - Lower Extremity Hip  Flexion/Marching: AROM, Strengthening, Both, 10 reps, Seated   Assessment/Plan    PT Assessment Patient needs continued PT services  PT Problem List Decreased strength;Decreased range of motion;Decreased activity tolerance;Decreased balance;Decreased mobility;Decreased knowledge of use of DME;Decreased knowledge of precautions;Decreased skin integrity;Pain       PT Treatment Interventions DME instruction;Gait training;Stair training;Functional mobility training;Therapeutic activities;Therapeutic exercise;Balance training;Patient/family education    PT Goals (Current goals can be found in the Care Plan section)  Acute Rehab PT Goals Patient Stated Goal: to improve pain and walking PT Goal Formulation: With patient Time For Goal Achievement: 05/05/24 Potential to Achieve Goals: Good    Frequency BID     Co-evaluation               AM-PAC PT 6 Clicks Mobility  Outcome Measure Help needed turning from your back to your side while in a flat bed without using bedrails?: A Little Help needed moving from lying on your back to sitting on the side of a flat bed without using bedrails?: A Little Help needed moving to and from a bed to a chair (including a wheelchair)?: A Little Help needed  standing up from a chair using your arms (e.g., wheelchair or bedside chair)?: A Lot Help needed to walk in hospital room?: A Little Help needed climbing 3-5 steps with a railing? : A Lot 6 Click Score: 16    End of Session Equipment Utilized During Treatment: Gait belt Activity Tolerance: Patient limited by pain Patient left: in bed;with call bell/phone within reach;with bed alarm set;with SCD's reapplied;Other (comment) (SCD to L LE; towel rolls to elevate B heels; polar care in place) Nurse Communication: Mobility status;Precautions;Weight bearing status;Other (comment) (Pt's pain status) PT Visit Diagnosis: Other abnormalities of gait and mobility (R26.89);Muscle weakness (generalized)  (M62.81);Pain Pain - Right/Left: Right Pain - part of body: Knee    Time: 8399-8355 PT Time Calculation (min) (ACUTE ONLY): 44 min   Charges:   PT Evaluation $PT Eval Low Complexity: 1 Low PT Treatments $Therapeutic Exercise: 8-22 mins $Therapeutic Activity: 8-22 mins PT General Charges $$ ACUTE PT VISIT: 1 Visit        Damien Caulk, PT 04/21/24, 5:12 PM

## 2024-04-22 ENCOUNTER — Telehealth (HOSPITAL_COMMUNITY): Payer: Self-pay | Admitting: Pharmacy Technician

## 2024-04-22 ENCOUNTER — Other Ambulatory Visit (HOSPITAL_COMMUNITY): Payer: Self-pay

## 2024-04-22 ENCOUNTER — Encounter: Payer: Self-pay | Admitting: Surgery

## 2024-04-22 DIAGNOSIS — M170 Bilateral primary osteoarthritis of knee: Secondary | ICD-10-CM | POA: Diagnosis not present

## 2024-04-22 MED ORDER — APIXABAN 2.5 MG PO TABS
2.5000 mg | ORAL_TABLET | Freq: Two times a day (BID) | ORAL | 0 refills | Status: DC
Start: 2024-04-22 — End: 2024-06-18

## 2024-04-22 MED ORDER — KETOROLAC TROMETHAMINE 0.5 % OP SOLN: 1.0000 [drp] | Freq: Four times a day (QID) | OPHTHALMIC | 0 refills | Status: AC

## 2024-04-22 MED ORDER — OXYCODONE HCL 5 MG PO TABS: 5.0000 mg | ORAL_TABLET | ORAL | 0 refills | Status: DC | PRN

## 2024-04-22 MED ORDER — ONDANSETRON HCL 4 MG PO TABS
4.0000 mg | ORAL_TABLET | Freq: Four times a day (QID) | ORAL | 0 refills | Status: DC | PRN
Start: 2024-04-22 — End: 2024-06-18

## 2024-04-22 NOTE — Telephone Encounter (Signed)
 Patient Product/process development scientist completed.    The patient is insured through [facility]. Patient has [employer], may use a copay card, and/or apply for patient assistance if available.    Ran test claim for Eliquis  2.5 mg and the current 30 day co-pay is $45.00.   This test claim was processed through [facility]- copay amounts may vary at other pharmacies due to pharmacy/plan contracts, or as the patient moves through the different stages of their insurance plan.     Reyes Sharps, CPHT Pharmacy Technician III Certified Patient Advocate [facility] Patient Advocate Team Direct Number: [phone]  Fax: [phone]

## 2024-04-22 NOTE — TOC CM/SW Note (Addendum)
 Patient is not able to walk the distance required to go the bathroom, or he/she is unable to safely negotiate stairs required to access the bathroom.  A 3in1 BSC will alleviate this problem

## 2024-04-22 NOTE — Anesthesia Postprocedure Evaluation (Signed)
 Anesthesia Post Note  Patient: Aleighna Wojtas Jares  Procedure(s) Performed: ARTHROPLASTY, KNEE, TOTAL (Right: Knee)  Patient location during evaluation: Nursing Unit Anesthesia Type: Spinal Level of consciousness: oriented and awake and alert Pain management: pain level controlled Vital Signs Assessment: post-procedure vital signs reviewed and stable Respiratory status: spontaneous breathing and respiratory function stable Cardiovascular status: blood pressure returned to baseline and stable Postop Assessment: no headache, no backache, no apparent nausea or vomiting, patient able to bend at knees and able to ambulate Anesthetic complications: no   No notable events documented.   Last Vitals:  Vitals:   04/22/24 0333 04/22/24 0751  BP: (!) 142/82 (!) 146/93  Pulse: 88 96  Resp: 18 17  Temp: 36.6 C 36.7 C  SpO2:  100%    Last Pain:  Vitals:   04/22/24 0751  TempSrc: Oral  PainSc: 1                  Wolf, Howell Groesbeck C

## 2024-04-22 NOTE — Discharge Instructions (Signed)

## 2024-04-22 NOTE — Evaluation (Signed)
 Occupational Therapy Evaluation Patient Details Name: Brandi Wiggins MRN: [id number] DOB: 1956/01/30 Today's Date: 04/22/2024   History of Present Illness   Pt is a 68 y.o. female s/p R TKA d/t DJD 04/21/24.  PMH includes h/o head injury as 68 y.o., SDH evacuation via craniotomy, htn, OA, L shoulder surgery 2016, R reverse TSA 2022.     Clinical Impressions Upon entering the room, pt seated in recliner chair and having just transitioned from PT session. Pt is motivated to return home. Pt reports living at home alone and being Ind in all aspects of care and working full time. Pt will have daughter coming to stay with her for ~ 2 weeks at discharge to provide 24/7 assist as needed. OT reviews use of polar care system and how to increase LB self care after surgery. Pt returns demonstrations and simulates LB dressing techniques without physical assistance. Discussed equipment needs for home set up and recommendations made. Pt does not have any further skilled acute OT needs at this time. OT to complete orders.      If plan is discharge home, recommend the following:   A little help with walking and/or transfers;A little help with bathing/dressing/bathroom;Assistance with cooking/housework;Help with stairs or ramp for entrance     Functional Status Assessment   Patient has had a recent decline in their functional status and demonstrates the ability to make significant improvements in function in a reasonable and predictable amount of time.     Equipment Recommendations   BSC/3in1;Other (comment) (2WW)      Precautions/Restrictions   Precautions Precautions: Knee;Fall Restrictions Weight Bearing Restrictions Per Provider Order: Yes RLE Weight Bearing Per Provider Order: Weight bearing as tolerated     Mobility Bed Mobility               General bed mobility comments: seated in recliner chair    Transfers Overall transfer level: Needs assistance Equipment used:  Rolling walker (2 wheels) Transfers: Sit to/from Stand Sit to Stand: Supervision                  Balance           Standing balance support: Bilateral upper extremity supported Standing balance-Leahy Scale: Good                             ADL either performed or assessed with clinical judgement   ADL Overall ADL's : Needs assistance/impaired                                     Functional mobility during ADLs: Supervision/safety;Rolling walker (2 wheels) General ADL Comments: supervision to thread clothing onto R LE. Pt would likely need assistance to tie shoe laces but could slip on shoes if needed. Daughter will be home with her 24/7 to assist as needed.     Vision Baseline Vision/History: 1 Wears glasses Patient Visual Report: No change from baseline              Pertinent Vitals/Pain Pain Assessment Pain Assessment: 0-10 Pain Score: 4  Pain Location: R knee Pain Descriptors / Indicators: Aching, Discomfort Pain Intervention(s): Monitored during session, Repositioned, Ice applied     Extremity/Trunk Assessment Upper Extremity Assessment Upper Extremity Assessment: Overall WFL for tasks assessed   Lower Extremity Assessment Lower Extremity Assessment: Defer to PT evaluation  Communication Communication Communication: No apparent difficulties   Cognition Arousal: Alert Behavior During Therapy: WFL for tasks assessed/performed                                 Following commands: Intact       Cueing  General Comments   Cueing Techniques: Verbal cues  encouraged polar care. provided patient with gait belt to bring home per daugther request yesterday           Home Living Family/patient expects to be discharged to:: Private residence Living Arrangements: Alone Available Help at Discharge: Family;Available 24 hours/day (daughter)   Home Access: Stairs to enter [employer] of Steps:  5 Entrance Stairs-Rails: Right;Left;Can reach both Home Layout: One level     Bathroom Shower/Tub: Chief Strategy Officer: Standard     Home Equipment: Cane - quad          Prior Functioning/Environment Prior Level of Function : Independent/Modified Independent;Driving             Mobility Comments: Modified independent ambulating with quad cane; no recent falls reported. ADLs Comments: Ind and working full time            OT Goals(Current goals can be found in the care plan section)   Acute Rehab OT Goals Patient Stated Goal: to go home OT Goal Formulation: With patient Time For Goal Achievement: 04/22/24 Potential to Achieve Goals: Fair   AM-PAC OT 6 Clicks Daily Activity     Outcome Measure Help from another person eating meals?: None Help from another person taking care of personal grooming?: None Help from another person toileting, which includes using toliet, bedpan, or urinal?: None Help from another person bathing (including washing, rinsing, drying)?: A Little Help from another person to put on and taking off regular upper body clothing?: None Help from another person to put on and taking off regular lower body clothing?: A Little 6 Click Score: 22   End of Session Equipment Utilized During Treatment: Rolling walker (2 wheels) Nurse Communication: Mobility status  Activity Tolerance: Patient tolerated treatment well Patient left: in chair;with call bell/phone within reach                   Time: 9141-9084 OT Time Calculation (min): 17 min Charges:  OT General Charges $OT Visit: 1 Visit OT Evaluation $OT Eval Low Complexity: 1 Low OT Treatments $Self Care/Home Management : 8-22 mins  Izetta Claude, MS, OTR/L , CBIS ascom [phone]  04/22/24, 9:20 AM

## 2024-04-22 NOTE — TOC Transition Note (Signed)
 Transition of Care [facility]) - Discharge Note   Patient Details  Name: Brandi Wiggins MRN: [id number] Date of Birth: [birth year]  Transition of Care [facility]) CM/SW Contact:  Marinda Cooks, RN Phone Number: 04/22/2024, 9:52 AM   Clinical Narrative:    This CM updated by covering MD pt medically cleared to dc today and has active DC order . This CM spoke with bedside RN and confirmed  [facility] services were prearranged with peri op staff . This CM confirmed HH referral was accepted. This CM spoke with pt introduced role and discussed DME recommendation with pt and provided choice. Pt expressed she would like to have referral sent to [facility] . This CM sent referral and confirmed it was received and DME would be delivered to pt's rm. DC transportation confirmed for pt with family .Medical team updated . No additional DC needs requested by medical team or identified by CM at this time .     Final next level of care: Home w Home Health Services Barriers to Discharge: No Barriers Identified   Patient Goals and CMS Choice Patient states their goals for this hospitalization and ongoing recovery are:: To have Rehab at Home via [facility]          Discharge Placement                  Name of family member notified: Patient Patient and family notified of of transfer: 04/22/24  Discharge Plan and Services Additional resources added to the After Visit Summary for                  DME Arranged: 3-N-1, Walker rolling DME Agency: [facility]         HH Agency: [facility] (Pre Arranged with Peri Op staff [place])        Social Drivers of Health (SDOH) Interventions SDOH Screenings   Food Insecurity: No Food Insecurity (04/21/2024)  Housing: Unknown (12/04/2023)   Received from [facility]  Transportation Needs: No Transportation Needs (04/21/2024)  Tobacco Use: High Risk (04/21/2024)     Readmission Risk Interventions     No data to display

## 2024-04-22 NOTE — Progress Notes (Signed)
 DISCHARGE NOTE:  Pt dc with IV removed and dc instructions given. Pt received a 3 in 1 and RW to hospital room. Pt has polar care and both TED hose. Pt voices no questions or concerns. Pt wheeled down to medical mall entrance by staff and pt's daughter provided transportation.

## 2024-04-22 NOTE — Discharge Summary (Signed)
 Physician Discharge Summary  Patient ID: Brandi Wiggins MRN: [id number] DOB/AGE: November 29, 1955 68 y.o.  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   Consultants (if any):   Discharged Condition: Improved  Hospital Course: Brandi Wiggins is an 68 y.o. female who was admitted 04/21/2024 with a diagnosis of primary osteoarthritis of the right knee and went to the operating room on 04/21/2024 and underwent the above named procedures.    Surgeries: Procedure(s): ARTHROPLASTY, KNEE, TOTAL on 04/21/2024 Patient tolerated the surgery well. Taken to PACU where she was stabilized and then transferred to the post op recovery area.  Started on Eliquis  2.5mg  every 12  hrs. Heels elevated on bed with rolled towels. No evidence of DVT. Negative Homan. Physical therapy started on day #45for gait training and transfer. OT started day #1 for ADL and assisted devices.  Patient's IV was removed on POD1.  Implants: Right TKA using all-cemented Zimmer Persona system with a #7 PCR femur, a(n) E-sized  tibial tray with a 10 mm medial congruent E-poly insert, and an 8.5 x 32 mm all-poly 3-pegged domed patella.   She was given perioperative antibiotics:  Anti-infectives (From admission, onward)    Start     Dose/Rate Route Frequency Ordered Stop   04/21/24 2115  ceFAZolin  (ANCEF ) IVPB  3g/150 mL premix        3 g 300 mL/hr over 30 Minutes Intravenous Every 6 hours 04/21/24 2100 04/21/24 2145   04/21/24 1400  ceFAZolin  (ANCEF ) IVPB 3g/150 mL premix        3 g 300 mL/hr over 30 Minutes Intravenous Every 6 hours 04/21/24 1221 04/22/24 2030   04/21/24 0630  ceFAZolin  (ANCEF ) IVPB 2g/100 mL premix        2 g 200 mL/hr over 30 Minutes Intravenous On call to O.R. 04/21/24 9384 04/21/24 0744     .  She was given sequential compression devices, early ambulation, and Eliquis  for DVT prophylaxis.  She benefited maximally from the hospital stay and there were no complications.    Recent vital signs:  Vitals:   04/21/24 2012 04/22/24 0333  BP: (!) 146/91 (!) 142/82  Pulse: 66 88  Resp: 18 18  Temp: (!) 97.2 F (36.2 C) 97.9 F (36.6 C)  SpO2: 97%     Recent laboratory studies:  Lab Results  Component Value Date   HGB 12.6 04/14/2024   HGB 12.2 04/05/2021   HGB 13.6 03/24/2021   Lab Results  Component Value Date   WBC 7.1 04/14/2024   PLT 407 (H) 04/14/2024   No results found for: INR Lab Results  Component Value Date   NA 138 04/14/2024   K 3.5 04/14/2024   CL 105 04/14/2024   CO2 28 04/14/2024   BUN 26 (H) 04/14/2024   CREATININE 0.78 04/14/2024   GLUCOSE 89 04/14/2024   Discharge  Medications:   Allergies as of 04/22/2024   No Known Allergies      Medication List     STOP taking these medications    aspirin  EC 325 MG tablet   meloxicam 15 MG tablet Commonly known as: MOBIC       TAKE these medications    albuterol  108 (90 Base) MCG/ACT inhaler Commonly known as: VENTOLIN  HFA Inhale 2 puffs into the lungs every 4 (four) hours as needed for wheezing or shortness of breath.   apixaban  2.5 MG Tabs tablet Commonly known as: ELIQUIS  Take 1 tablet (2.5 mg total) by mouth 2 (two) times daily.   ketorolac  0.5 % ophthalmic solution Commonly known as: ACULAR  Place 1 drop into the left eye every 6 (six) hours.    losartan -hydrochlorothiazide  100-25 MG tablet Commonly known as: HYZAAR Take 1 tablet by mouth daily.   ondansetron  4 MG tablet Commonly known as: ZOFRAN  Take 1 tablet (4 mg total) by mouth every 6 (six) hours as needed for nausea.   oxyCODONE  5 MG immediate release tablet Commonly known as: Oxy IR/ROXICODONE  Take 1-2 tablets (5-10 mg total) by mouth every 4 (four) hours as needed for moderate pain (pain score 4-6).   phentermine  37.5 MG tablet Commonly known as: ADIPEX-P  Take 37.5 mg by mouth daily.   topiramate  25 MG tablet Commonly known as: TOPAMAX  Take 25 mg by mouth daily.   traMADol 50 MG tablet Commonly known as: ULTRAM Take 50 mg by mouth daily.               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 EXAM: PORTABLE RIGHT KNEE - 1-2 VIEW COMPARISON:  None Available. FINDINGS: Right knee arthroplasty in expected alignment. No periprosthetic lucency or fracture. There has been patellar resurfacing. Recent postsurgical change includes air and edema in the soft tissues and joint space. Anterior skin staples in place. IMPRESSION: Right knee arthroplasty without immediate postoperative complication. Electronically Signed   By: Andrea Gasman M.D.   On: 04/21/2024 11:39   Disposition: Plan for possible d/c home today with HHPT pending progress with therapy.   Follow-up Information     Kip Lynwood Double, PA-C Follow up in 14 day(s).   Specialty: Physician Assistant Why: [facility] [place]  information: [address] [place] [address] [phone]                Signed: Lynwood LITTIE Kip PA-C 04/22/2024, 7:38 AM

## 2024-04-22 NOTE — Progress Notes (Signed)
  Subjective: 1 Day Post-Op Procedure(s) (LRB): ARTHROPLASTY, KNEE, TOTAL (Right) Patient reports pain as mild.   Patient is well, and has had no acute complaints or problems Plan is to go Home after hospital stay. Negative for chest pain and shortness of breath Fever: no Gastrointestinal:Negative for nausea and vomiting Reports she is passing gas without pain this AM.  Objective: Vital signs in last 24 hours: Temp:  [97.2 F (36.2 C)-98.9 F (37.2 C)] 97.9 F (36.6 C) (09/03 0333) Pulse Rate:  [66-88] 88 (09/03 0333) Resp:  [12-27] 18 (09/03 0333) BP: (85-162)/(48-102) 142/82 (09/03 0333) SpO2:  [97 %-100 %] 97 % (09/02 2012)  Intake/Output from previous day:  Intake/Output Summary (Last 24 hours) at 04/22/2024 0735 Last data filed at 04/21/2024 1852 Gross per 24 hour  Intake 1349.32 ml  Output 10 ml  Net 1339.32 ml    Intake/Output this shift: No intake/output data recorded.  Labs: No results for input(s): HGB in the last 72 hours. No results for input(s): WBC, RBC, HCT, PLT in the last 72 hours. No results for input(s): NA, K, CL, CO2, BUN, CREATININE, GLUCOSE, CALCIUM in the last 72 hours. No results for input(s): LABPT, INR in the last 72 hours.   EXAM General - Patient is Alert, Appropriate, and Oriented Extremity - ABD soft Neurovascular intact Dorsiflexion/Plantar flexion intact Incision: scant drainage No cellulitis present Compartment soft Dressing/Incision - Mild bloody drainage noted to the right knee honeycomb dressing. Motor Function - intact, moving foot and toes well on exam.  Abdomen soft with intact bowel sounds.  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   Weight as of 04/14/24: 124.5 kg. Advance diet Up with therapy  Vitals reviewed this AM. Up with PT today.  Does have 5 steps to clear to get into home. Continue to work on a BM. Plan for possible d/c home today with HHPT.  DVT Prophylaxis - TED hose and Eliquis  Weight-Bearing as tolerated to right leg  J. Gustavo Level, PA-C [facility] 04/22/2024, 7:35 AM

## 2024-04-22 NOTE — Plan of Care (Signed)

## 2024-04-22 NOTE — Progress Notes (Signed)
 Physical Therapy Treatment Patient Details Name: Brandi Wiggins MRN: [id number] DOB: Feb 19, 1956 Today's Date: 04/22/2024   History of Present Illness Pt is a 68 y.o. female s/p R TKA d/t DJD 04/21/24.  PMH includes h/o head injury as 68 y.o., SDH evacuation via craniotomy, htn, OA, L shoulder surgery 2016, R reverse TSA 2022.    PT Comments  Increased activity tolerance with reported improvement in pain. Patient progressed ambulation into the hallway with cues for sequencing using rolling walker. Stair training completed. Mobility is adequate for discharge home with family support. Recommend to continue PT to maximize independence and decrease caregiver burden.    If plan is discharge home, recommend the following: A little help with bathing/dressing/bathroom;A lot of help with walking and/or transfers;Assistance with cooking/housework;Assist for transportation;Help with stairs or ramp for entrance   Can travel by private vehicle        Equipment Recommendations  Rolling walker (2 wheels);BSC/3in1    Recommendations for Other Services       Precautions / Restrictions Precautions Precautions: Knee;Fall Recall of Precautions/Restrictions: Intact Restrictions Weight Bearing Restrictions Per Provider Order: Yes RLE Weight Bearing Per Provider Order: Weight bearing as tolerated     Mobility  Bed Mobility               General bed mobility comments: not assessed as patient sitting up on arrival and post session    Transfers Overall transfer level: Needs assistance Equipment used: Rolling walker (2 wheels) Transfers: Sit to/from Stand Sit to Stand: Contact guard assist           General transfer comment: cues for RLE positioning with sitting for comfort. good safety awareness with hand placement    Ambulation/Gait Ambulation/Gait assistance: Supervision Gait Distance (Feet): 125 Feet Assistive device: Rolling walker (2 wheels) Gait Pattern/deviations: Step-to  pattern, Step-through pattern, Decreased stance time - right, Decreased dorsiflexion - right, Antalgic       General Gait Details: cues for rolling walker and BLE sequencing. reinforcement to use rolling walker for safety. improvement in pain compared to yesterday per patient report   Stairs Stairs: Yes Stairs assistance: Supervision Stair Management: Two rails, Step to pattern, Forwards Number of Stairs: 4 General stair comments: visual demonstration and education for sequencing. patient demonstrated correct technique without physical assistance   Wheelchair Mobility     Tilt Bed    Modified Rankin (Stroke Patients Only)       Balance Overall balance assessment: Needs assistance Sitting-balance support: No upper extremity supported, Feet supported Sitting balance-Leahy Scale: Good     Standing balance support: Single extremity supported Standing balance-Leahy Scale: Fair                              Hotel manager: No apparent difficulties  Cognition Arousal: Alert Behavior During Therapy: WFL for tasks assessed/performed   PT - Cognitive impairments: No apparent impairments                         Following commands: Intact      Cueing Cueing Techniques: Verbal cues, Visual cues  Exercises Total Joint Exercises Goniometric ROM: R knee flexion 89 degrees    General Comments General comments (skin integrity, edema, etc.): encouraged polar care. provided patient with gait belt to bring home per daugther request yesterday      Pertinent Vitals/Pain Pain Assessment Pain Assessment: 0-10 Pain Score: 8  Pain Location:  R knee Pain Descriptors / Indicators: Discomfort Pain Intervention(s): Premedicated before session, Monitored during session, Limited activity within patient's tolerance, Repositioned    Home Living Family/patient expects to be discharged to:: Private residence Living Arrangements:  Alone Available Help at Discharge: Family;Available 24 hours/day (daughter)   Home Access: Stairs to enter Entrance Stairs-Rails: Right;Left;Can reach both Entrance Stairs-Number of Steps: 5   Home Layout: One level Home Equipment: Cane - quad      Prior Function            PT Goals (current goals can now be found in the care plan section) Acute Rehab PT Goals Patient Stated Goal: to improve pain and walking PT Goal Formulation: With patient Time For Goal Achievement: 05/05/24 Potential to Achieve Goals: Good Progress towards PT goals: Progressing toward goals    Frequency    BID      PT Plan      Co-evaluation              AM-PAC PT 6 Clicks Mobility   Outcome Measure  Help needed turning from your back to your side while in a flat bed without using bedrails?: A Little Help needed moving from lying on your back to sitting on the side of a flat bed without using bedrails?: A Little Help needed moving to and from a bed to a chair (including a wheelchair)?: A Little Help needed standing up from a chair using your arms (e.g., wheelchair or bedside chair)?: A Lot Help needed to walk in hospital room?: A Little Help needed climbing 3-5 steps with a railing? : A Lot 6 Click Score: 16    End of Session Equipment Utilized During Treatment: Gait belt Activity Tolerance: Patient tolerated treatment well Patient left: in chair;with call bell/phone within reach (OT present) Nurse Communication: Mobility status PT Visit Diagnosis: Other abnormalities of gait and mobility (R26.89);Muscle weakness (generalized) (M62.81);Pain     Time: 9169-9141 PT Time Calculation (min) (ACUTE ONLY): 28 min  Charges:    $Gait Training: 8-22 mins $Therapeutic Activity: 8-22 mins PT General Charges $$ ACUTE PT VISIT: 1 Visit                     Randine Essex, PT, MPT    Randine LULLA Essex 04/22/2024, 9:17 AM

## 2024-05-15 ENCOUNTER — Other Ambulatory Visit: Payer: Self-pay | Admitting: Student

## 2024-05-15 ENCOUNTER — Ambulatory Visit
Admission: RE | Admit: 2024-05-15 | Discharge: 2024-05-15 | Disposition: A | Discharge status: Home / Self Care | Source: Ambulatory Visit | Attending: Student | Admitting: Student

## 2024-05-15 DIAGNOSIS — M7989 Other specified soft tissue disorders: Secondary | ICD-10-CM | POA: Diagnosis present

## 2024-05-15 DIAGNOSIS — Z96651 Presence of right artificial knee joint: Secondary | ICD-10-CM | POA: Diagnosis present

## 2024-06-01 ENCOUNTER — Other Ambulatory Visit: Payer: Self-pay | Admitting: Surgery

## 2024-06-03 ENCOUNTER — Encounter: Admission: RE | Disposition: A | Payer: Self-pay | Source: Home / Self Care | Attending: Surgery

## 2024-06-03 ENCOUNTER — Encounter: Payer: Self-pay | Admitting: Surgery

## 2024-06-03 ENCOUNTER — Ambulatory Visit

## 2024-06-03 ENCOUNTER — Other Ambulatory Visit: Payer: Self-pay

## 2024-06-03 ENCOUNTER — Ambulatory Visit: Admission: RE | Admit: 2024-06-03 | Discharge: 2024-06-06 | Disposition: A | Attending: Surgery | Admitting: Surgery

## 2024-06-03 DIAGNOSIS — M1711 Unilateral primary osteoarthritis, right knee: Secondary | ICD-10-CM | POA: Insufficient documentation

## 2024-06-03 DIAGNOSIS — I10 Essential (primary) hypertension: Secondary | ICD-10-CM | POA: Insufficient documentation

## 2024-06-03 DIAGNOSIS — Y831 Surgical operation with implant of artificial internal device as the cause of abnormal reaction of the patient, or of later complication, without mention of misadventure at the time of the procedure: Secondary | ICD-10-CM | POA: Insufficient documentation

## 2024-06-03 DIAGNOSIS — M009 Pyogenic arthritis, unspecified: Secondary | ICD-10-CM

## 2024-06-03 DIAGNOSIS — F17210 Nicotine dependence, cigarettes, uncomplicated: Secondary | ICD-10-CM | POA: Diagnosis not present

## 2024-06-03 DIAGNOSIS — T8453XA Infection and inflammatory reaction due to internal right knee prosthesis, initial encounter: Secondary | ICD-10-CM | POA: Diagnosis present

## 2024-06-03 DIAGNOSIS — Z79899 Other long term (current) drug therapy: Secondary | ICD-10-CM | POA: Insufficient documentation

## 2024-06-03 DIAGNOSIS — T8131XA Disruption of external operation (surgical) wound, not elsewhere classified, initial encounter: Secondary | ICD-10-CM | POA: Insufficient documentation

## 2024-06-03 DIAGNOSIS — Z96651 Presence of right artificial knee joint: Secondary | ICD-10-CM

## 2024-06-03 DIAGNOSIS — Z96611 Presence of right artificial shoulder joint: Secondary | ICD-10-CM

## 2024-06-03 DIAGNOSIS — Z01812 Encounter for preprocedural laboratory examination: Secondary | ICD-10-CM

## 2024-06-03 HISTORY — PX: SECONDARY CLOSURE OF WOUND: SHX6208

## 2024-06-03 SURGERY — SECONDARY CLOSURE OF WOUND
Anesthesia: General | Site: Knee | Laterality: Right

## 2024-06-03 MED ORDER — CEFAZOLIN SODIUM-DEXTROSE 2-4 GM/100ML-% IV SOLN: 2.0000 g | Freq: Four times a day (QID) | INTRAVENOUS | Status: DC

## 2024-06-03 MED ORDER — LOSARTAN POTASSIUM 50 MG PO TABS
100.0000 mg | ORAL_TABLET | Freq: Every day | ORAL | Status: AC
Start: 2024-06-04 — End: ?
  Administered 2024-06-04 – 2024-06-06 (×3): 100 mg via ORAL
  Filled 2024-06-03 (×3): qty 2

## 2024-06-03 MED ORDER — ONDANSETRON HCL 4 MG PO TABS: 4.0000 mg | ORAL_TABLET | Freq: Four times a day (QID) | ORAL | Status: DC | PRN

## 2024-06-03 MED ORDER — OXYCODONE HCL 5 MG PO TABS
ORAL_TABLET | ORAL | Status: AC
  Filled 2024-06-03: qty 1

## 2024-06-03 MED ORDER — KETOROLAC TROMETHAMINE 30 MG/ML IJ SOLN
INTRAMUSCULAR | Status: AC
Start: 2024-06-03 — End: 2024-06-03
  Filled 2024-06-03: qty 1

## 2024-06-03 MED ORDER — TRANEXAMIC ACID-NACL 1000-0.7 MG/100ML-% IV SOLN
INTRAVENOUS | Status: DC | PRN
  Administered 2024-06-03: 1000 mg via INTRAVENOUS

## 2024-06-03 MED ORDER — GLYCOPYRROLATE 0.2 MG/ML IJ SOLN
INTRAMUSCULAR | Status: DC | PRN
  Administered 2024-06-03: .2 mg via INTRAVENOUS

## 2024-06-03 MED ORDER — ACETAMINOPHEN 10 MG/ML IV SOLN
INTRAVENOUS | Status: AC
  Filled 2024-06-03: qty 100

## 2024-06-03 MED ORDER — ORAL CARE MOUTH RINSE: 15.0000 mL | Freq: Once | OROMUCOSAL | Status: AC

## 2024-06-03 MED ORDER — METOCLOPRAMIDE HCL 5 MG/ML IJ SOLN: 5.0000 mg | Freq: Three times a day (TID) | INTRAMUSCULAR | Status: DC | PRN

## 2024-06-03 MED ORDER — BUPIVACAINE HCL (PF) 0.5 % IJ SOLN
INTRAMUSCULAR | Status: AC
  Filled 2024-06-03: qty 30

## 2024-06-03 MED ORDER — HYDROMORPHONE HCL 1 MG/ML IJ SOLN
INTRAMUSCULAR | Status: DC | PRN
  Administered 2024-06-03 (×2): .5 mg via INTRAVENOUS

## 2024-06-03 MED ORDER — CEFAZOLIN SODIUM-DEXTROSE 2-4 GM/100ML-% IV SOLN
INTRAVENOUS | Status: AC
  Filled 2024-06-03: qty 100

## 2024-06-03 MED ORDER — VANCOMYCIN HCL 1500 MG/300ML IV SOLN
1500.0000 mg | Freq: Once | INTRAVENOUS | Status: AC
  Administered 2024-06-03: 1500 mg via INTRAVENOUS
  Filled 2024-06-03 (×3): qty 300

## 2024-06-03 MED ORDER — PROPOFOL 10 MG/ML IV BOLUS
INTRAVENOUS | Status: DC | PRN
  Administered 2024-06-03: 200 mg via INTRAVENOUS

## 2024-06-03 MED ORDER — APIXABAN 2.5 MG PO TABS
2.5000 mg | ORAL_TABLET | Freq: Two times a day (BID) | ORAL | Status: DC
  Administered 2024-06-04 – 2024-06-06 (×5): 2.5 mg via ORAL
  Filled 2024-06-03 (×5): qty 1

## 2024-06-03 MED ORDER — KETOROLAC TROMETHAMINE 15 MG/ML IJ SOLN
15.0000 mg | Freq: Once | INTRAMUSCULAR | Status: AC
  Administered 2024-06-03: 15 mg via INTRAVENOUS

## 2024-06-03 MED ORDER — HYDROCHLOROTHIAZIDE 25 MG PO TABS
25.0000 mg | ORAL_TABLET | Freq: Every day | ORAL | Status: DC
  Administered 2024-06-04 – 2024-06-06 (×3): 25 mg via ORAL
  Filled 2024-06-03 (×3): qty 1

## 2024-06-03 MED ORDER — SODIUM CHLORIDE 0.9 % IV SOLN
2.0000 g | Freq: Three times a day (TID) | INTRAVENOUS | Status: DC
  Administered 2024-06-04 – 2024-06-05 (×5): 2 g via INTRAVENOUS
  Filled 2024-06-03 (×6): qty 12.5

## 2024-06-03 MED ORDER — VANCOMYCIN HCL IN DEXTROSE 1-5 GM/200ML-% IV SOLN
1000.0000 mg | Freq: Once | INTRAVENOUS | Status: AC
  Administered 2024-06-04: 1000 mg via INTRAVENOUS

## 2024-06-03 MED ORDER — MAGNESIUM HYDROXIDE 400 MG/5ML PO SUSP: 30.0000 mL | Freq: Every day | ORAL | Status: DC | PRN

## 2024-06-03 MED ORDER — DEXAMETHASONE SOD PHOSPHATE PF 10 MG/ML IJ SOLN
INTRAMUSCULAR | Status: DC | PRN
  Administered 2024-06-03: 10 mg via INTRAVENOUS

## 2024-06-03 MED ORDER — FENTANYL CITRATE (PF) 100 MCG/2ML IJ SOLN
INTRAMUSCULAR | Status: AC
  Filled 2024-06-03: qty 2

## 2024-06-03 MED ORDER — PHENTERMINE HCL 37.5 MG PO TABS: 37.5000 mg | ORAL_TABLET | Freq: Every day | ORAL | Status: DC

## 2024-06-03 MED ORDER — KETOROLAC TROMETHAMINE 15 MG/ML IJ SOLN
7.5000 mg | Freq: Four times a day (QID) | INTRAMUSCULAR | Status: AC
  Administered 2024-06-03 – 2024-06-04 (×4): 7.5 mg via INTRAVENOUS
  Filled 2024-06-03 (×4): qty 1

## 2024-06-03 MED ORDER — HYDROMORPHONE HCL 1 MG/ML IJ SOLN
0.2500 mg | INTRAMUSCULAR | Status: DC | PRN
  Administered 2024-06-03 (×2): 0.5 mg via INTRAVENOUS

## 2024-06-03 MED ORDER — DEXMEDETOMIDINE HCL IN NACL 80 MCG/20ML IV SOLN
INTRAVENOUS | Status: DC | PRN
  Administered 2024-06-03: 8 ug via INTRAVENOUS
  Administered 2024-06-03 (×2): 4 ug via INTRAVENOUS

## 2024-06-03 MED ORDER — FLEET ENEMA RE ENEM: 1.0000 | ENEMA | Freq: Once | RECTAL | Status: DC | PRN

## 2024-06-03 MED ORDER — KETOROLAC TROMETHAMINE 15 MG/ML IJ SOLN
INTRAMUSCULAR | Status: AC
  Filled 2024-06-03: qty 1

## 2024-06-03 MED ORDER — LOSARTAN POTASSIUM-HCTZ 100-25 MG PO TABS
1.0000 | ORAL_TABLET | Freq: Every day | ORAL | Status: DC
Start: 2024-06-04 — End: 2024-06-03

## 2024-06-03 MED ORDER — GENTAMICIN SULFATE 40 MG/ML IJ SOLN
INTRAMUSCULAR | Status: AC
  Filled 2024-06-03: qty 4

## 2024-06-03 MED ORDER — FENTANYL CITRATE (PF) 100 MCG/2ML IJ SOLN
INTRAMUSCULAR | Status: DC | PRN
  Administered 2024-06-03 (×2): 50 ug via INTRAVENOUS
  Administered 2024-06-03 (×2): 25 ug via INTRAVENOUS
  Administered 2024-06-03: 50 ug via INTRAVENOUS

## 2024-06-03 MED ORDER — VANCOMYCIN HCL IN DEXTROSE 1-5 GM/200ML-% IV SOLN
INTRAVENOUS | Status: AC
  Filled 2024-06-03: qty 200

## 2024-06-03 MED ORDER — LACTATED RINGERS IV SOLN: INTRAVENOUS | Status: DC

## 2024-06-03 MED ORDER — ONDANSETRON HCL 4 MG/2ML IJ SOLN
INTRAMUSCULAR | Status: DC | PRN
  Administered 2024-06-03: 4 mg via INTRAVENOUS

## 2024-06-03 MED ORDER — PHENYLEPHRINE HCL (PRESSORS) 10 MG/ML IV SOLN
INTRAVENOUS | Status: DC | PRN
  Administered 2024-06-03 (×2): 80 ug via INTRAVENOUS

## 2024-06-03 MED ORDER — METOCLOPRAMIDE HCL 10 MG PO TABS: 5.0000 mg | ORAL_TABLET | Freq: Three times a day (TID) | ORAL | Status: DC | PRN

## 2024-06-03 MED ORDER — HYDROMORPHONE HCL 1 MG/ML IJ SOLN
INTRAMUSCULAR | Status: AC
  Filled 2024-06-03: qty 1

## 2024-06-03 MED ORDER — SODIUM CHLORIDE 0.9 % IR SOLN
Status: DC | PRN
  Administered 2024-06-03: 2400 mL
  Administered 2024-06-03: 3000 mL

## 2024-06-03 MED ORDER — SODIUM CHLORIDE 0.9 % IV SOLN
INTRAVENOUS | Status: DC | PRN
  Administered 2024-06-03: 1001 mL

## 2024-06-03 MED ORDER — MIDAZOLAM HCL 2 MG/2ML IJ SOLN
INTRAMUSCULAR | Status: DC | PRN
  Administered 2024-06-03: 2 mg via INTRAVENOUS

## 2024-06-03 MED ORDER — ACETAMINOPHEN 325 MG PO TABS
325.0000 mg | ORAL_TABLET | Freq: Four times a day (QID) | ORAL | Status: DC | PRN
  Administered 2024-06-05: 650 mg via ORAL
  Filled 2024-06-03: qty 2

## 2024-06-03 MED ORDER — TOPIRAMATE 25 MG PO TABS
25.0000 mg | ORAL_TABLET | Freq: Every day | ORAL | Status: DC
  Filled 2024-06-03 (×5): qty 1

## 2024-06-03 MED ORDER — VANCOMYCIN HCL 1000 MG IV SOLR
INTRAVENOUS | Status: AC
Start: 2024-06-03 — End: 2024-06-03
  Filled 2024-06-03: qty 20

## 2024-06-03 MED ORDER — DROPERIDOL 2.5 MG/ML IJ SOLN: 0.6250 mg | Freq: Once | INTRAMUSCULAR | Status: DC | PRN

## 2024-06-03 MED ORDER — CHLORHEXIDINE GLUCONATE 0.12 % MT SOLN
OROMUCOSAL | Status: AC
  Filled 2024-06-03: qty 15

## 2024-06-03 MED ORDER — LIDOCAINE HCL (CARDIAC) PF 100 MG/5ML IV SOSY
PREFILLED_SYRINGE | INTRAVENOUS | Status: DC | PRN
Start: 2024-06-03 — End: 2024-06-03
  Administered 2024-06-03: 100 mg via INTRAVENOUS

## 2024-06-03 MED ORDER — TRANEXAMIC ACID-NACL 1000-0.7 MG/100ML-% IV SOLN
INTRAVENOUS | Status: AC
  Filled 2024-06-03: qty 100

## 2024-06-03 MED ORDER — MIDAZOLAM HCL 2 MG/2ML IJ SOLN
INTRAMUSCULAR | Status: AC
  Filled 2024-06-03: qty 2

## 2024-06-03 MED ORDER — ACETAMINOPHEN 500 MG PO TABS
1000.0000 mg | ORAL_TABLET | Freq: Four times a day (QID) | ORAL | Status: AC
  Administered 2024-06-03 – 2024-06-04 (×4): 1000 mg via ORAL
  Filled 2024-06-03 (×4): qty 2

## 2024-06-03 MED ORDER — OXYCODONE HCL 5 MG PO TABS
5.0000 mg | ORAL_TABLET | ORAL | Status: DC | PRN
  Administered 2024-06-03 (×2): 5 mg via ORAL
  Administered 2024-06-04 – 2024-06-06 (×8): 10 mg via ORAL
  Filled 2024-06-03 (×9): qty 2

## 2024-06-03 MED ORDER — FENTANYL CITRATE (PF) 100 MCG/2ML IJ SOLN
25.0000 ug | INTRAMUSCULAR | Status: DC | PRN
  Administered 2024-06-03: 50 ug via INTRAVENOUS
  Administered 2024-06-03 (×2): 25 ug via INTRAVENOUS
  Administered 2024-06-03: 50 ug via INTRAVENOUS

## 2024-06-03 MED ORDER — CEFAZOLIN SODIUM-DEXTROSE 2-4 GM/100ML-% IV SOLN
2.0000 g | INTRAVENOUS | Status: AC
  Administered 2024-06-03: 2 g via INTRAVENOUS

## 2024-06-03 MED ORDER — BUPIVACAINE HCL (PF) 0.5 % IJ SOLN
INTRAMUSCULAR | Status: AC
Start: 2024-06-03 — End: 2024-06-03
  Filled 2024-06-03: qty 30

## 2024-06-03 MED ORDER — DIPHENHYDRAMINE HCL 12.5 MG/5ML PO ELIX: 12.5000 mg | ORAL_SOLUTION | ORAL | Status: DC | PRN

## 2024-06-03 MED ORDER — BISACODYL 10 MG RE SUPP: 10.0000 mg | Freq: Every day | RECTAL | Status: DC | PRN

## 2024-06-03 MED ORDER — DOCUSATE SODIUM 100 MG PO CAPS
100.0000 mg | ORAL_CAPSULE | Freq: Two times a day (BID) | ORAL | Status: DC
  Administered 2024-06-03 – 2024-06-06 (×6): 100 mg via ORAL
  Filled 2024-06-03 (×6): qty 1

## 2024-06-03 MED ORDER — ONDANSETRON HCL 4 MG/2ML IJ SOLN: 4.0000 mg | Freq: Four times a day (QID) | INTRAMUSCULAR | Status: DC | PRN

## 2024-06-03 MED ORDER — SODIUM CHLORIDE 0.9 % IV SOLN: INTRAVENOUS | Status: AC

## 2024-06-03 MED ORDER — CHLORHEXIDINE GLUCONATE 0.12 % MT SOLN
15.0000 mL | Freq: Once | OROMUCOSAL | Status: AC
  Administered 2024-06-03: 15 mL via OROMUCOSAL

## 2024-06-03 MED ORDER — ACETAMINOPHEN 10 MG/ML IV SOLN
INTRAVENOUS | Status: DC | PRN
  Administered 2024-06-03: 1000 mg via INTRAVENOUS

## 2024-06-03 SURGICAL SUPPLY — 59 items
BLADE SURG SZ10 CARB STEEL (BLADE) ×1 IMPLANT
BLADE SURG SZ20 CARB STEEL (BLADE) IMPLANT
BNDG COHESIVE 4X5 TAN STRL LF (GAUZE/BANDAGES/DRESSINGS) ×1 IMPLANT
BNDG ELASTIC 4INX 5YD STR LF (GAUZE/BANDAGES/DRESSINGS) ×1 IMPLANT
BNDG ESMARCH 4X12 STRL LF (GAUZE/BANDAGES/DRESSINGS) ×1 IMPLANT
CANISTER WOUND CARE 500ML ATS (WOUND CARE) IMPLANT
CHLORAPREP W/TINT 26 (MISCELLANEOUS) ×1 IMPLANT
CNTNR URN SCR LID CUP LEK RST (MISCELLANEOUS) ×1 IMPLANT
CUFF TOURN SGL QUICK 18X4 (TOURNIQUET CUFF) IMPLANT
CUFF TOURN SGL QUICK 34 NS (TOURNIQUET CUFF) IMPLANT
CUFF TRNQT CYL 24X4X16.5-23 (TOURNIQUET CUFF) IMPLANT
CUFF TRNQT CYL 34X4.125X (TOURNIQUET CUFF) IMPLANT
DRAPE IMP U-DRAPE 54X76 (DRAPES) IMPLANT
DRSG OPSITE POSTOP 4X10 (GAUZE/BANDAGES/DRESSINGS) IMPLANT
DRSG OPSITE POSTOP 4X8 (GAUZE/BANDAGES/DRESSINGS) IMPLANT
DRSG XEROFORM 1X8 (GAUZE/BANDAGES/DRESSINGS) IMPLANT
ELECT CAUTERY BLADE 6.4 (BLADE) IMPLANT
ELECTRODE REM PT RTRN 9FT ADLT (ELECTROSURGICAL) ×1 IMPLANT
GLOVE BIO SURGEON STRL SZ8 (GLOVE) ×1 IMPLANT
GLOVE INDICATOR 8.0 STRL GRN (GLOVE) ×1 IMPLANT
GLOVE SURG ORTHO 8.5 STRL (GLOVE) ×1 IMPLANT
GLOVE SURG SYN 6.5 PF PI (GLOVE) IMPLANT
GLOVE SURG SYN 7.5 PF PI (GLOVE) IMPLANT
GOWN STRL REUS W/ TWL LRG LVL3 (GOWN DISPOSABLE) ×1 IMPLANT
GOWN STRL REUS W/ TWL XL LVL3 (GOWN DISPOSABLE) ×1 IMPLANT
HOOD PEEL AWAY T7 (MISCELLANEOUS) IMPLANT
IMMBOLIZER KNEE 19 BLUE UNIV (SOFTGOODS) IMPLANT
INSERT TIB ASF SZ 6-7/EF 10 RT (Insert) IMPLANT
KIT DRAIN HEMOVAC 3/32 400ML (MISCELLANEOUS) IMPLANT
KIT PREVENA INCISION MGT20CM45 (CANNISTER) IMPLANT
KIT STIMULAN RAPID CURE 5CC (Orthopedic Implant) IMPLANT
KIT TURNOVER KIT A (KITS) ×1 IMPLANT
LABEL OR SOLS (LABEL) ×1 IMPLANT
MANIFOLD NEPTUNE II (INSTRUMENTS) ×1 IMPLANT
NDL SAFETY ECLIP 18X1.5 (MISCELLANEOUS) ×1 IMPLANT
NDL SAFETY ECLIPSE 18X1.5 (NEEDLE) IMPLANT
PACK EXTREMITY ARMC (MISCELLANEOUS) ×1 IMPLANT
PAD ABD DERMACEA PRESS 5X9 (GAUZE/BANDAGES/DRESSINGS) IMPLANT
PAD CAST 4YDX4 CTTN HI CHSV (CAST SUPPLIES) ×1 IMPLANT
PADDING CAST BLEND 4X4 STRL (MISCELLANEOUS) ×1 IMPLANT
PENCIL SMOKE EVACUATOR (MISCELLANEOUS) ×1 IMPLANT
SOL .9 NS 3000ML IRR UROMATIC (IV SOLUTION) IMPLANT
SOLN 0.9% NACL 1000 ML (IV SOLUTION) ×1 IMPLANT
SOLN 0.9% NACL POUR BTL 1000ML (IV SOLUTION) ×1 IMPLANT
SPLINT CAST 1 STEP 3X12 (MISCELLANEOUS) ×1 IMPLANT
SPONGE T-LAP 18X18 ~~LOC~~+RFID (SPONGE) ×1 IMPLANT
STAPLER SKIN PROX 35W (STAPLE) ×1 IMPLANT
STOCKINETTE BIAS CUT 4 980044 (GAUZE/BANDAGES/DRESSINGS) ×1 IMPLANT
STOCKINETTE IMPERV 14X48 (MISCELLANEOUS) IMPLANT
STOCKINETTE IMPERVIOUS 9X36 MD (GAUZE/BANDAGES/DRESSINGS) ×1 IMPLANT
SUCTION TUBE FRAZIER 10FR DISP (SUCTIONS) IMPLANT
SUT PROLENE 4 0 PS 2 18 (SUTURE) ×2 IMPLANT
SUT VIC AB 0 CT1 36 (SUTURE) IMPLANT
SUT VIC AB 2-0 CT1 (SUTURE) IMPLANT
SWAB CULTURE AMIES ANAERIB BLU (MISCELLANEOUS) ×1 IMPLANT
SYR 10ML LL (SYRINGE) ×1 IMPLANT
TIP FAN IRRIG PULSAVAC PLUS (DISPOSABLE) IMPLANT
TRAP FLUID SMOKE EVACUATOR (MISCELLANEOUS) ×1 IMPLANT
WATER STERILE IRR 500ML POUR (IV SOLUTION) ×1 IMPLANT

## 2024-06-03 NOTE — Transfer of Care (Signed)
 Immediate Anesthesia Transfer of Care Note  Patient: Trinity Haun Venti  Procedure(s) Performed: SECONDARY CLOSURE OF WOUND, Polyethylene Exchange (Right: Knee)  Patient Location: PACU  Anesthesia Type:General  Level of Consciousness: awake, alert , oriented, and patient cooperative  Airway & Oxygen Therapy: Patient Spontanous Breathing and Patient connected to face mask oxygen  Post-op Assessment: Report given to RN, Post -op Vital signs reviewed and stable, and Patient moving all extremities X 4  Post vital signs: Reviewed and stable  Last Vitals:  Vitals Value Taken Time  BP 130/69 06/03/24 16:15  Temp    Pulse 69 06/03/24 16:15  Resp 11 06/03/24 16:15  SpO2 100 % 06/03/24 16:15  Vitals shown include unfiled device data.  Last Pain:  Vitals:   06/03/24 1306  TempSrc: Temporal  PainSc: 0-No pain         Complications: No notable events documented.

## 2024-06-03 NOTE — Op Note (Signed)
 06/03/2024  4:33 PM  Patient:   Brandi Wiggins  Pre-Op Diagnosis:   Superficial focal wound dehiscence status post prior right total knee arthroplasty.  Post-Op Diagnosis:   Superficial wound dehiscence with extension into the joint status post prior right total knee arthroplasty.  Procedure:   Irrigation and debridement with polyethylene exchange and primary closure status post right total knee arthroplasty.  Surgeon:   DOROTHA Reyes Maltos, MD  Assistant:   Lorn Just, RN  Anesthesia:   General LMA  Findings:   As above.  Complications:   None  Fluids:   900 cc crystalloid  EBL:   200 cc  UOP:   None  TT:   86 at 325 mmHg  Drains:   Prevena x 1, Hemovac x 1  Closure:   Staples  Implants:   Zimmer Biomet 10 mm medial congruent polyethylene  Brief Clinical Note:   The patient is a 68 year old female who is now 6 weeks status post a right total knee arthroplasty.  The patient was doing quite well until last week when she noted a small area of punctate drainage from her incision site.  She was evaluated in the office and a wound VAC applied as a precaution.  The patient was seen 2 in follow-up days ago.  At the time she was doing well, but after hearing the options for treatment, elected to proceed with a formal washout and repeat closure of the area of drainage.  She presents at this time for this procedure.  Procedure:   The patient was brought into the operating room and laid in the supine position.  After adequate general laryngeal mask anesthesia was obtained, the Praveena was removed from the leg.  There was no erythema and no active drainage from the wound at this time.  In fact, overall looks quite good.  The patient's right lower extremity was prepped with ChloraPrep solution before being draped sterilely.  Preoperative antibiotics were administered.  A timeout was performed to verify the appropriate surgical site before the limb was exsanguinated with the tourniquet  and the tourniquet inflated to 325 mmHg.    The area of prior drainage was ellipsed and carried down into the subcutaneous tissues.  A small amount of fluid could be expressed so this was cultured.  During placement of the culture swab, it was noted to immediately penetrate into the joint.  Therefore the entire incision was reopened and carried down through the subcutaneous tissues to expose the superficial retinaculum.  The medial flap was elevated sufficiently to expose the medial border of the patella.  A medial parapatellar incision was then made leaving 3 to 4 mm of tissue the medial border the patella and extending distally along the medial border the patella tendon approximately into the medial third of the quadriceps tendon.  No obvious purulent material was identified.  However, there were areas of some synovitis and fibrinous tissues which were debrided and sent off for culture and sensitivity.  The old polyethylene was removed and the soft tissue surfaces scuffed with a large curette and Cobb elevator.  The wound was copiously irrigated with 1 L of gentamicin impregnated irrigation before the wound was further irrigated with another 6 L of sterile saline solution using the jet lavage system.  A fresh 10 mm polyethylene insert was inserted and is locking mechanism verified.  The knee was placed through a range of motion and demonstrated excellent motion and patellar tracking.  The knee also was stable to  varus and valgus stressing in both flexion and extension.  One limb to a Hemovac drain was placed into the joint to help remove any blood that might collect postoperatively.  In addition, one batch of dissolving vancomycin impregnated beads was made on the back table and, once they had hardened, placed into the medial and lateral gutters of the joint.  The arthrotomy site was reapproximated using #0 Vicryl interrupted sutures before the subcutaneous tissues were closed using 2-0 Vicryl interrupted  sutures.  The skin was closed using staples.  A large Prevena dressing was applied over the wound before the the knee was placed into a knee immobilizer.  The patient was then awakened, extubated, and returned to the recovery room in satisfactory condition after tolerating the procedure well.

## 2024-06-03 NOTE — Anesthesia Postprocedure Evaluation (Signed)
 Anesthesia Post Note  Patient: Brandi Wiggins  Procedure(s) Performed: SECONDARY CLOSURE OF WOUND, Polyethylene Exchange (Right: Knee)  Patient location during evaluation: PACU Anesthesia Type: General Level of consciousness: awake and alert Pain management: pain level controlled Vital Signs Assessment: post-procedure vital signs reviewed and stable Respiratory status: spontaneous breathing, nonlabored ventilation, respiratory function stable and patient connected to nasal cannula oxygen Cardiovascular status: blood pressure returned to baseline and stable Postop Assessment: no apparent nausea or vomiting Anesthetic complications: no   No notable events documented.   Last Vitals:  Vitals:   06/03/24 1828 06/03/24 1830  BP:  99/87  Pulse: 81 79  Resp: 12 13  Temp:    SpO2: 98% 100%    Last Pain:  Vitals:   06/03/24 1828  TempSrc:   PainSc: 6                  [name]

## 2024-06-03 NOTE — Anesthesia Procedure Notes (Signed)
 Procedure Name: LMA Insertion Date/Time: 06/03/2024 1:50 PM  Performed by: Pate Aylward, CRNAPre-anesthesia Checklist: Patient identified, Emergency Drugs available, Suction available and Patient being monitored Patient Re-evaluated:Patient Re-evaluated prior to induction Oxygen Delivery Method: Circle System Utilized Preoxygenation: Pre-oxygenation with 100% oxygen Induction Type: IV induction Ventilation: Mask ventilation without difficulty LMA: LMA inserted LMA Size: 4.0 Number of attempts: 1 Airway Equipment and Method: Bite block Placement Confirmation: positive ETCO2 Tube secured with: Tape Dental Injury: Teeth and Oropharynx as per pre-operative assessment

## 2024-06-03 NOTE — H&P (Signed)
 History of Present Illness:  Brandi Wiggins is a 68 y.o. female who presents for follow-up now 6 weeks status post right total knee arthroplasty. The patient's postoperative course has been uneventful until last week when she began to develop some increased serous drainage from the anterior aspect of her knee. She saw Brandi Level, PA-C, 3 days ago who diagnosed her with a seroma in the area of the prepatellar bursa. He applied a Prevena over this area to maintain sterility and to drain out any additional fluid. The patient still notes some discomfort in her knee which she rates at 4/10 on today's visit, and for which she has been taking Mobic, ibuprofen, and tramadol as necessary with temporary partial relief of her symptoms. She had been attending physical therapy until last week when she was advised to stop therapy to allow her wound to settle down. She is ambulating with a cane for balance and support. She is not yet able to reciprocate stairs. She is sleeping well at night. She denies any reinjury to the knee, and denies any fevers or chills.  Current Outpatient Medications:  aspirin  325 MG tablet Take by mouth  ibuprofen (ADVIL,MOTRIN) 200 MG tablet Take 800 mg by mouth every 6 (six) hours as needed for Pain  losartan -hydrochlorothiazide  (HYZAAR) 100-25 mg tablet Take 1 tablet by mouth once daily for Blood Pressure  meloxicam (MOBIC) 15 MG tablet TAKE 1 TABLET BY MOUTH EVERY DAY 30 tablet 1  traMADoL (ULTRAM) 50 mg tablet Take 1 tablet (50 mg total) by mouth every 8 (eight) hours as needed 21 tablet 0   Allergies: No Known Allergies  Past Medical History:  History of head injury (She had a head injury as a 16-year-old that sounds like a subdural hematoma with burr holes to relieve the pressure; no long-term damage from this)  Hypertension  Osteoarthritis   Past Surgical History:  Arthroscopic SLAP repair, arthroscopic debridement of grade 3 chondromalacial changes of the glenoid, arthroscopic  subacromial decompression, and mini open repair of rotator cuff tear with mini open biceps tenodesis left shoulder Left 10/14/2014  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 Edie) JOINT REPLACEMENT 03/2020  SUBDURAL HEMATOMA EVACUATION VIA CRANIOTOMY   Family History:  Cancer Mother  Heart disease Father Brandi Wiggins   Social History:   Socioeconomic History:  Marital status: Legally Separated  Tobacco Use  Smoking status: Some Days  Types: Cigarettes  Smokeless tobacco: Current  Vaping Use  Vaping status: Never Used  Substance and Sexual Activity  Alcohol use: Yes  Comment: Occasionally during events  Drug use: No  Sexual activity: Not Currently  Partners: Male   Social Drivers of Health:   Physicist, medical Strain: Low Risk (05/06/2024)  Overall Financial Resource Strain (CARDIA)  Difficulty of Paying Living Expenses: Not hard at all  Food Insecurity: No Food Insecurity (05/06/2024)  Hunger Vital Sign  Worried About Running Out of Food in the Last Year: Never true  Ran Out of Food in the Last Year: Never true  Transportation Needs: No Transportation Needs (05/06/2024)  PRAPARE - Risk analyst (Medical): No  Lack of Transportation (Non-Medical): No   Review of Systems:  A comprehensive 14 point ROS was performed, reviewed, and the pertinent orthopaedic findings are documented in the HPI.  Physical Exam: Vitals:  06/01/24 1018  BP: 118/86  Weight: (!) 124.7 kg (275 lb)  Height: 170.2 cm (5' 7)  PainSc: 4  PainLoc: Knee   General/Constitutional: Pleasant significantly overweight middle-age female  in no acute distress. Neuro/Psych: Normal mood and affect, oriented to person, place and time. Eyes: Non-icteric. Pupils are equal, round, and reactive to light, and exhibit synchronous movement. ENT: Unremarkable. Lymphatic: No palpable adenopathy. Respiratory: Lungs clear to auscultation, Normal chest excursion, No  wheezes, and Non-labored breathing Cardiovascular: Regular rate and rhythm. No murmurs. and No edema, swelling or tenderness, except as noted in detailed exam. Integumentary: No impressive skin lesions present, except as noted in detailed exam. Musculoskeletal: Unremarkable, except as noted in detailed exam.  Right knee exam: The patient ambulates with a mild limp, favoring her right leg, and uses a cane for balance and support. Skin inspection of the right knee demonstrates a Praveena dressing/drain over the anterior aspect of the knee which appears to be functioning properly. There is perhaps mild swelling diffusely around the knee, as well as a trace effusion, but no erythema, ecchymosis, abrasions, or other skin abnormalities are identified. Actively, she is able to extend her knee to 0 degrees and flex her knee to 95 degrees. Her patella tracks well and is without crepitus. The knee is stable to varus and valgus stressing. She is neurovascularly intact to the right lower extremity and foot.  Assessment: 1. Status post total knee replacement using cement, right.  2. Superficial disruption or dehiscence of operation wound.  3. Primary osteoarthritis of right knee.   Plan: The treatment options were discussed with the patient. In addition, patient educational materials were provided regarding the diagnosis and treatment options. At this point, there does not appear to be any concern for a deep infection. However, given her size and smoking history, I am concerned that her wound may continue to have difficulty healing. Therefore, I have recommended that we proceed with a formal irrigation and debridement of the wound with probable delayed primary closure in the operating room. The patient is in agreement with this plan. The procedure was discussed with the patient, as were the potential risks (including bleeding, infection, nerve and/or blood vessel injury, persistent or recurrent pain, stiffness of  the knee, need for further surgery, blood clots, strokes, heart attacks and/or arhythmias, pneumonia, etc.) and benefits. The patient states her understanding and wishes to proceed. All of the patient's questions and concerns were answered. She can call any time with further concerns. She will follow up post-surgery, routine.    H&P reviewed and patient re-examined. No changes.

## 2024-06-03 NOTE — Consult Note (Signed)
 Pharmacy Antibiotic Note  Brandi Wiggins is a 68 y.o. female admitted on 06/03/2024 with wound infection.  Pharmacy has been consulted for cefepime and vancomycin dosing. Afeb. S/p closure of wound of right knee. Cultures obtained.   Plan: Will start cefepime 2 g q8H   Will give vancomycin 1500 mg + 1000 mg x 1 for a total of 2500 mg loading dose. Scr has not been drawn yet. Baseline Scr is 0.8. Will follow labs and order maintenance dose as appropriate.    Height: 5' 7 (170.2 cm) Weight: 124.7 kg (275 lb) IBW/kg (Calculated) : 61.6  Temp (24hrs), Avg:97.6 F (36.4 C), Min:96.8 F (36 C), Max:98.4 F (36.9 C)  No results for input(s): WBC, CREATININE, LATICACIDVEN, VANCOTROUGH, VANCOPEAK, VANCORANDOM, GENTTROUGH, GENTPEAK, GENTRANDOM, TOBRATROUGH, TOBRAPEAK, TOBRARND, AMIKACINPEAK, AMIKACINTROU, AMIKACIN in the last 168 hours.  CrCl cannot be calculated (Patient's most recent lab result is older than the maximum 21 days allowed.).    No Known Allergies   Thank you for allowing pharmacy to be a part of this patient's care.  Cathaleen GORMAN Blanch, PharmD, BCPS 06/03/2024 5:11 PM

## 2024-06-03 NOTE — Anesthesia Preprocedure Evaluation (Signed)
 Anesthesia Evaluation  Patient identified by MRN, date of birth, ID band Patient awake    Reviewed: Allergy & Precautions, NPO status , Patient's Chart, lab work & pertinent test results  History of Anesthesia Complications Negative for: history of anesthetic complications  Airway Mallampati: II  TM Distance: >3 FB Neck ROM: Full    Dental  (+) Partial Lower, Partial Upper   Pulmonary neg shortness of breath, neg sleep apnea, neg COPD, neg recent URI, Current Smoker and Patient abstained from smoking.   Pulmonary exam normal breath sounds clear to auscultation       Cardiovascular Exercise Tolerance: Good METShypertension, Pt. on medications (-) angina (-) CAD and (-) Past MI (-) dysrhythmias  Rhythm:Regular Rate:Normal - Systolic murmurs    Neuro/Psych negative neurological ROS  negative psych ROS   GI/Hepatic Neg liver ROS,neg GERD  ,,(+)     (-) substance abuse    Endo/Other  neg diabetes  Class 3 obesity  Renal/GU negative Renal ROS     Musculoskeletal   Abdominal   Peds  Hematology   Anesthesia Other Findings Past Medical History: No date: Hypertension  Reproductive/Obstetrics                              Anesthesia Physical Anesthesia Plan  ASA: 3  Anesthesia Plan: General   Post-op Pain Management:    Induction: Intravenous  PONV Risk Score and Plan: 2 and Treatment may vary due to age or medical condition, Ondansetron , Dexamethasone  and Midazolam   Airway Management Planned: LMA and Oral ETT  Additional Equipment: None  Intra-op Plan:   Post-operative Plan: Extubation in OR  Informed Consent: I have reviewed the patients History and Physical, chart, labs and discussed the procedure including the risks, benefits and alternatives for the proposed anesthesia with the patient or authorized representative who has indicated his/her understanding and acceptance.      Dental advisory given  Plan Discussed with: CRNA and Surgeon  Anesthesia Plan Comments: (Discussed risks of anesthesia with patient, including PONV, sore throat, lip/dental damage. Rare risks discussed as well, such as cardiorespiratory and neurological sequelae, and allergic reactions. Patient understands. Discussed r/b/a of interscalene block, including elective nature. Risks discussed: - Rare: bleeding, infection, nerve damage - shortness of breath from hemidiaphragmatic paralysis - unilateral horner's syndrome - poor/non-working blocks Patient understands and agrees. )         Anesthesia Quick Evaluation

## 2024-06-04 ENCOUNTER — Encounter: Payer: Self-pay | Admitting: Surgery

## 2024-06-04 DIAGNOSIS — M96842 Postprocedural seroma of a musculoskeletal structure following a musculoskeletal system procedure: Secondary | ICD-10-CM | POA: Diagnosis not present

## 2024-06-04 DIAGNOSIS — T8453XA Infection and inflammatory reaction due to internal right knee prosthesis, initial encounter: Secondary | ICD-10-CM | POA: Diagnosis not present

## 2024-06-04 DIAGNOSIS — Z96651 Presence of right artificial knee joint: Secondary | ICD-10-CM

## 2024-06-04 LAB — BASIC METABOLIC PANEL WITH GFR
Anion gap: 14 (ref 5–15)
BUN: 22 mg/dL (ref 8–23)
CO2: 21 mmol/L — ABNORMAL LOW (ref 22–32)
Calcium: 9.9 mg/dL (ref 8.9–10.3)
Chloride: 102 mmol/L (ref 98–111)
Creatinine, Ser: 0.95 mg/dL (ref 0.44–1.00)
GFR, Estimated: >60 mL/min (ref >60)
Glucose, Bld: 164 mg/dL — ABNORMAL HIGH (ref 70–99)
Potassium: 3.6 mmol/L (ref 3.5–5.1)
Sodium: 137 mmol/L (ref 135–145)

## 2024-06-04 LAB — CBC
HCT: 32 % — ABNORMAL LOW (ref 36.0–46.0)
Hemoglobin: 10.4 g/dL — ABNORMAL LOW (ref 12.0–15.0)
MCH: 28.6 pg (ref 26.0–34.0)
MCHC: 32.5 g/dL (ref 30.0–36.0)
MCV: 87.9 fL (ref 80.0–100.0)
Platelets: 475 K/uL — ABNORMAL HIGH (ref 150–400)
RBC: 3.64 MIL/uL — ABNORMAL LOW (ref 3.87–5.11)
RDW: 15.2 % (ref 11.5–15.5)
WBC: 8 K/uL (ref 4.0–10.5)
nRBC: 0 % (ref 0.0–0.2)

## 2024-06-04 MED ORDER — VANCOMYCIN HCL 1500 MG/300ML IV SOLN
1500.0000 mg | INTRAVENOUS | Status: DC
  Administered 2024-06-04: 1500 mg via INTRAVENOUS
  Filled 2024-06-04: qty 300

## 2024-06-04 NOTE — Consult Note (Signed)
 NAME: Brandi Wiggins  DOB: 1956-06-15  MRN: [id number]  Date/Time: 06/04/2024 7:44 PM  REQUESTING PROVIDER: Dr.Poggi Subjective:  REASON FOR CONSULT: Rt knee PJI ? Brandi Wiggins is a 68 y.o. with a history of  HTN, rt shoulder arthroplasty recent rt TKA on 04/21/24., following which she had swelling of the leg and serosanguinous drainage after she went home. During her first follow up with the ortho team they saw blisters at the surgical site and placed prevena and put her on keflex  500mg  Q6 for 10 days- No culture was taken . Then she started to have some pain working with PT   On 10/10 culture was sent and 10/13 she saw Dr.poggi who took her for I/D on 06/03/24. As he noted communication of the superficial fluid to the joint , he did deep washout and polyethylene liner exchange and deep cultures were sent Pt is post op area and I am seeing the patient or antibiotic management She is currently on vanoc and cefepime No fever or chills' She is feeling fine Daughter at bed side  Past Medical History:  Diagnosis Date   GERD (gastroesophageal reflux disease)    Headache    History of bronchitis    Hypertension    Morbid obesity (HCC)    Pre-diabetes    Primary osteoarthritis of right knee     Past Surgical History:  Procedure Laterality Date   burr holes to relieve the pressure     due to head injury   at age of 1 years   REVERSE SHOULDER ARTHROPLASTY Right 04/04/2021   Procedure: REVERSE SHOULDER ARTHROPLASTY;  Surgeon: Edie Norleen PARAS, MD;  Location: [facility];  Service: Orthopedics;  Laterality: Right;   SECONDARY CLOSURE OF WOUND Right 06/03/2024   Procedure: SECONDARY CLOSURE OF WOUND, Polyethylene Exchange;  Surgeon: Edie Norleen PARAS, MD;  Location: [facility] OR[facility];  Service: Orthopedics;  Laterality: Right;   SHOULDER SURGERY Left    rotator cuff repair   TOTAL KNEE ARTHROPLASTY Right 04/21/2024   Procedure: ARTHROPLASTY, KNEE, TOTAL;  Surgeon: Edie Norleen PARAS, MD;  Location: [facility];   Service: Orthopedics;  Laterality: Right;    Social History   Socioeconomic History   Marital status: Legally Separated    Spouse name: Not on file   Number of children: Not on file   Years of education: Not on file   Highest education level: Not on file  Occupational History   Not on file  Tobacco Use   Smoking status: Every Day    Current packs/day: 0.25    Average packs/day: 0.3 packs/day for 0.4 years (0.1 ttl pk-yrs)    Types: Cigarettes    Start date: 01/13/2024   Smokeless tobacco: Never  Vaping Use   Vaping status: Never Used  Substance and Sexual Activity   Alcohol use: Yes    Alcohol/week: 1.0 standard drink of alcohol    Types: 1 Glasses of wine per week    Comment: occa   Drug use: No   Sexual activity: Not on file  Other Topics Concern   Not on file  Social History Narrative   Lives alone. But has two daughters and two sisters support persons.    Social Drivers of Corporate investment banker Strain: Low Risk  (05/06/2024)   Received from [facility]   Overall Financial Resource Strain (CARDIA)    Difficulty of Paying Living Expenses: Not hard at all  Food Insecurity: No Food Insecurity (06/03/2024)   Hunger  Vital Sign    Worried About Programme researcher, broadcasting/film/video in the Last Year: Never true    Ran Out of Food in the Last Year: Never true  Transportation Needs: No Transportation Needs (06/03/2024)   PRAPARE - Administrator, Civil Service (Medical): No    Lack of Transportation (Non-Medical): No  Physical Activity: Not on file  Stress: Not on file  Social Connections: Unknown (06/03/2024)   Social Connection and Isolation Panel    Frequency of Communication with Friends and Family: More than three times a week    Frequency of Social Gatherings with Friends and Family: Twice a week    Attends Religious Services: More than 4 times per year    Active Member of [employer] or Organizations: No    Attends Banker Meetings: 1 to 4  times per year    Marital Status: Not on file  Intimate Partner Violence: Patient Declined (06/03/2024)   Humiliation, Afraid, Rape, and Kick questionnaire    Fear of Current or Ex-Partner: Patient declined    Emotionally Abused: Patient declined    Physically Abused: Patient declined    Sexually Abused: Patient declined    Family History  Problem Relation Age of Onset   Cancer Mother    CAD Father    No Known Allergies I? Current Facility-Administered Medications  Medication Dose Route Frequency Provider Last Rate Last Admin   acetaminophen  (TYLENOL ) tablet 325-650 mg  325-650 mg Oral Q6H PRN Poggi, John J, MD       apixaban  (ELIQUIS ) tablet 2.5 mg  2.5 mg Oral BID Poggi, John J, MD   2.5 mg at 06/04/24 0907   bisacodyl  (DULCOLAX) suppository 10 mg  10 mg Rectal Daily PRN Poggi, John J, MD       ceFEPIme (MAXIPIME) 2 g in sodium chloride  0.9 % 100 mL IVPB  2 g Intravenous Q8H Poggi, John J, MD   Stopped at 06/04/24 1201   diphenhydrAMINE  (BENADRYL ) 12.5 MG/5ML elixir 12.5-25 mg  12.5-25 mg Oral Q4H PRN Poggi, John J, MD       docusate sodium  (COLACE) capsule 100 mg  100 mg Oral BID Poggi, John J, MD   100 mg at 06/04/24 9092   losartan  (COZAAR ) tablet 100 mg  100 mg Oral Daily Belue, Nathan S, RPH   100 mg at 06/04/24 9092   And   hydrochlorothiazide  (HYDRODIURIL ) tablet 25 mg  25 mg Oral Daily Belue, Nathan S, RPH   25 mg at 06/04/24 9092   magnesium  hydroxide (MILK OF MAGNESIA) suspension 30 mL  30 mL Oral Daily PRN Poggi, Norleen PARAS, MD       metoCLOPramide  (REGLAN ) tablet 5-10 mg  5-10 mg Oral Q8H PRN Poggi, John J, MD       Or   metoCLOPramide  (REGLAN ) injection 5-10 mg  5-10 mg Intravenous Q8H PRN Poggi, John J, MD       ondansetron  (ZOFRAN ) tablet 4 mg  4 mg Oral Q6H PRN Poggi, John J, MD       Or   ondansetron  (ZOFRAN ) injection 4 mg  4 mg Intravenous Q6H PRN Poggi, John J, MD       oxyCODONE  (Oxy IR/ROXICODONE ) immediate release tablet 5-10 mg  5-10 mg Oral Q4H PRN Poggi, John  J, MD   10 mg at 06/04/24 9167   sodium phosphate  (FLEET) enema 1 enema  1 enema Rectal Once PRN Poggi, Norleen PARAS, MD       topiramate  (TOPAMAX )  tablet 25 mg  25 mg Oral Daily Poggi, Norleen PARAS, MD       vancomycin (VANCOREADY) IVPB 1500 mg/300 mL  1,500 mg Intravenous Q24H Lenon Elsie HERO, RPH         Abtx:  Anti-infectives (From admission, onward)    Start     Dose/Rate Route Frequency Ordered Stop   06/04/24 2200  vancomycin (VANCOREADY) IVPB 1500 mg/300 mL        1,500 mg 150 mL/hr over 120 Minutes Intravenous Every 24 hours 06/04/24 0804     06/04/24 0600  ceFAZolin  (ANCEF ) IVPB 2g/100 mL premix        2 g 200 mL/hr over 30 Minutes Intravenous On call to O.R. 06/03/24 1245 06/04/24 0630   06/04/24 0000  ceFAZolin  (ANCEF ) IVPB 2g/100 mL premix  Status:  Discontinued        2 g 200 mL/hr over 30 Minutes Intravenous Every 6 hours 06/03/24 2153 06/03/24 2214   06/03/24 2200  ceFEPIme (MAXIPIME) 2 g in sodium chloride  0.9 % 100 mL IVPB        2 g 200 mL/hr over 30 Minutes Intravenous Every 8 hours 06/03/24 1719     06/03/24 2000  vancomycin (VANCOREADY) IVPB 1500 mg/300 mL       Placed in Followed by Linked Group   1,500 mg 150 mL/hr over 120 Minutes Intravenous  Once 06/03/24 1719 06/03/24 2329   06/03/24 2000  vancomycin (VANCOCIN) IVPB 1000 mg/200 mL premix       Placed in Followed by Linked Group   1,000 mg 200 mL/hr over 60 Minutes Intravenous  Once 06/03/24 1719 06/04/24 0100   06/03/24 1501  gentamicin 80 mg/cefazolin  1000 mg in normal saline 1000 mL irrigation  Status:  Discontinued          As needed 06/03/24 1501 06/03/24 1608       REVIEW OF SYSTEMS:  Const: negative fever, negative chills, negative weight loss Eyes: negative diplopia or visual changes, negative eye pain ENT: negative coryza, negative sore throat Resp: negative cough, hemoptysis, dyspnea Cards: negative for chest pain, palpitations, lower extremity edema GU: negative for frequency, dysuria and  hematuria GI: Negative for abdominal pain, diarrhea, bleeding, constipation Skin: negative for rash and pruritus Heme: negative for easy bruising and gum/nose bleeding MS:rt  knee swelling and discharge Neurolo:negative for headaches, dizziness, vertigo, memory problems  Psych: negative for feelings of anxiety, depression  Endocrine: negative for thyroid, diabetes Allergy/Immunology- negative for any medication or food allergies  Objective:  VITALS:  BP 133/74 (BP Location: Left Arm)   Pulse 84   Temp 97.8 F (36.6 C) (Oral)   Resp 16   Ht 5' 7 (1.702 m)   Wt 124.7 kg   SpO2 100%   BMI 43.07 kg/m   PHYSICAL EXAM:  General: Alert, cooperative, no distress, appears stated age.  Head: Normocephalic, without obvious abnormality, atraumatic. Eyes: Conjunctivae clear, anicteric sclerae. Pupils are equal ENT Nares normal. No drainage or sinus tenderness. Lips, mucosa, and tongue normal. No Thrush Neck: Supple, symmetrical, no adenopathy, thyroid: non tender no carotid bruit and no JVD. Back: No CVA tenderness. Lungs: Clear to auscultation bilaterally. No Wheezing or Rhonchi. No rales. Heart: Regular rate and rhythm, no murmur, rub or gallop. Abdomen: Soft, non-tender,not distended. Bowel sounds normal. No masses Extremities: rt knee/leg brace Left leg minimal edema' Skin: No rashes or lesions. Or bruising Lymph: Cervical, supraclavicular normal. Neurologic: Grossly non-focal Pertinent Labs Lab Results CBC    Component Value  Date/Time   WBC 8.0 06/04/2024 0555   RBC 3.64 (L) 06/04/2024 0555   HGB 10.4 (L) 06/04/2024 0555   HCT 32.0 (L) 06/04/2024 0555   PLT 475 (H) 06/04/2024 0555   MCV 87.9 06/04/2024 0555   MCH 28.6 06/04/2024 0555   MCHC 32.5 06/04/2024 0555   RDW 15.2 06/04/2024 0555   LYMPHSABS 3.5 04/14/2024 1335   MONOABS 0.7 04/14/2024 1335   EOSABS 0.2 04/14/2024 1335   BASOSABS 0.0 04/14/2024 1335       Latest Ref Rng & Units 06/04/2024    5:55 AM  04/14/2024    1:35 PM 04/05/2021    3:22 AM  CMP  Glucose 70 - 99 mg/dL 835  89  888   BUN 8 - 23 mg/dL 22  26  20    Creatinine 0.44 - 1.00 mg/dL 9.04  9.21  9.16   Sodium 135 - 145 mmol/L 137  138  130   Potassium 3.5 - 5.1 mmol/L 3.6  3.5  3.8   Chloride 98 - 111 mmol/L 102  105  99   CO2 22 - 32 mmol/L 21  28  22    Calcium 8.9 - 10.3 mg/dL 9.9  9.8  89.7   Total Protein 6.5 - 8.1 g/dL  6.9    Total Bilirubin 0.0 - 1.2 mg/dL  0.5    Alkaline Phos 38 - 126 U/L  91    AST 15 - 41 U/L  23    ALT 0 - 44 U/L  23        Microbiology: Recent Results (from the past 240 hours)  Aerobic/Anaerobic Culture w Gram Stain (surgical/deep wound)     Status: None (Preliminary result)   Collection Time: 06/03/24  2:18 PM   Specimen: Leg, Right; Tissue  Result Value Ref Range Status   Specimen Description   Final    WOUND Performed at [facility], [address], [place], [place] [address]    Special Requests   Final    RIGHT LEG Performed at [facility], [address] [place], [place] [address]    Gram Stain NO WBC SEEN NO ORGANISMS SEEN   Final   Culture   Final    NO GROWTH < 12 HOURS Performed at [facility], [address], [place], [place] [address]    Report Status PENDING  Incomplete  Aerobic/Anaerobic Culture w Gram Stain (surgical/deep wound)     Status: None (Preliminary result)   Collection Time: 06/03/24  2:31 PM   Specimen: Leg, Right; Tissue  Result Value Ref Range Status   Specimen Description   Final    WOUND Performed at [facility], [address] [place], [place] [address]    Special Requests RIGHT KNEE  Final   Gram Stain   Final    RARE WBC PRESENT, PREDOMINANTLY PMN NO ORGANISMS SEEN    Culture   Final    NO GROWTH < 12 HOURS Performed at [facility], [address] [place], [place] [address]    Report Status PENDING  Incomplete      IMAGING RESULTS: 05/15/24 Doppler no  DVT ? Impression/Recommendation ?Seroma of the rt knee following TKA- r/o septic arthritis of a prosthetic joint S/p I/D and poly exchange- culture pending Pt had taken 0 days of keflex  in sept and completed it on 9/29 Continue vanco and cefepime If culture positive then can target antibiotic If culture neg would then need broad spectrum  like dapto and ceftriaxone for 4 weeks  HTN on Losartan  /HCTZ  Anemia  Will discuss with Dr.Poggi regarding her disposition as she may need to be educated on infusion if going home  This consult involved complex antimicrobial management ? ? I have personally spent  60---minutes involved in face-to-face and non-face-to-face activities for this patient on the day of the visit. Professional time spent includes the following activities: Preparing to see the patient (review of tests), Obtaining and/or reviewing separately obtained history (admission/discharge record), Performing a medically appropriate examination and/or evaluation , Ordering medications/tests/procedures, referring and communicating with other health care professionals, Documenting clinical information in the EMR, Independently interpreting results (not separately reported), Communicating results to the patient/,daughter Counseling and educating the patient/daughter and Care coordination (not separately reported).    ________________________________________________  Note:  This document was prepared using Conservation officer, historic buildings and may include unintentional dictation errors.

## 2024-06-04 NOTE — Progress Notes (Signed)
 Physical Therapy Treatment Patient Details Name: Brandi Wiggins MRN: [id number] DOB: 11/11/1955 Today's Date: 06/04/2024   History of Present Illness Brandi Wiggins is a 68 y.o. female admitted on 06/03/2024 with wound infection.  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 Edie.    PT Comments  Patient received in recliner she is ready for PT session. She reports mild pain. She requires min A to stand from low recliner. Ambulated 150 feet with RW and performed active knee flexion in sitting edge of bed. Patient will continue to benefit from skilled PT to improve independence and safety with mobility.     If plan is discharge home, recommend the following: A little help with walking and/or transfers;A little help with bathing/dressing/bathroom;Assist for transportation;Help with stairs or ramp for entrance   Can travel by private vehicle      yes  Equipment Recommendations  None recommended by PT    Recommendations for Other Services       Precautions / Restrictions Precautions Precautions: Fall Recall of Precautions/Restrictions: Intact Required Braces or Orthoses: Knee Immobilizer - Right Knee Immobilizer - Right: On at all times;Other (comment) (except with PT) Restrictions Weight Bearing Restrictions Per Provider Order: Yes RLE Weight Bearing Per Provider Order: Weight bearing as tolerated     Mobility  Bed Mobility Overal bed mobility: Needs Assistance Bed Mobility: Sit to Supine     Supine to sit: Contact guard, Used rails, HOB elevated Sit to supine: Contact guard assist   General bed mobility comments: cga to get R LE onto bed    Transfers Overall transfer level: Needs assistance Equipment used: Rolling walker (2 wheels) Transfers: Sit to/from Stand Sit to Stand: Min assist           General transfer comment: cues and assist to power up neeeded    Ambulation/Gait Ambulation/Gait assistance: Supervision Gait Distance (Feet): 150 Feet Assistive device:  Rolling walker (2 wheels) Gait Pattern/deviations: Step-through pattern, Decreased step length - right, Decreased step length - left, Decreased stride length, Trunk flexed Gait velocity: decr     General Gait Details: cues needed for safe walker use expecially with turning.   Stairs             Wheelchair Mobility     Tilt Bed    Modified Rankin (Stroke Patients Only)       Balance Overall balance assessment: Modified Independent                                          Communication Communication Communication: No apparent difficulties  Cognition Arousal: Alert Behavior During Therapy: WFL for tasks assessed/performed   PT - Cognitive impairments: No apparent impairments                         Following commands: Intact      Cueing Cueing Techniques: Verbal cues  Exercises Total Joint Exercises Long Arc Quad: AROM, Right, 10 reps Goniometric ROM: 0-70 grossly    General Comments        Pertinent Vitals/Pain Pain Assessment Pain Assessment: Faces Pain Score: 10-Worst pain ever Faces Pain Scale: Hurts little more Pain Location: R knee Pain Descriptors / Indicators: Discomfort, Grimacing, Guarding Pain Intervention(s): Monitored during session, Repositioned    Home Living  Prior Function            PT Goals (current goals can now be found in the care plan section) Acute Rehab PT Goals Patient Stated Goal: return home, improve pain PT Goal Formulation: With patient Time For Goal Achievement: 06/11/24 Potential to Achieve Goals: Good Progress towards PT goals: Progressing toward goals    Frequency    BID      PT Plan      Co-evaluation              AM-PAC PT 6 Clicks Mobility   Outcome Measure  Help needed turning from your back to your side while in a flat bed without using bedrails?: A Little Help needed moving from lying on your back to sitting on the side  of a flat bed without using bedrails?: A Little Help needed moving to and from a bed to a chair (including a wheelchair)?: A Little Help needed standing up from a chair using your arms (e.g., wheelchair or bedside chair)?: A Little Help needed to walk in hospital room?: A Little Help needed climbing 3-5 steps with a railing? : A Little 6 Click Score: 18    End of Session   Activity Tolerance: Patient tolerated treatment well Patient left: in bed;with call bell/phone within reach Nurse Communication: Mobility status PT Visit Diagnosis: Other abnormalities of gait and mobility (R26.89);Difficulty in walking, not elsewhere classified (R26.2);Pain Pain - Right/Left: Right Pain - part of body: Knee     Time: 8579-8561 PT Time Calculation (min) (ACUTE ONLY): 18 min  Charges:    $Gait Training: 8-22 mins PT General Charges $$ ACUTE PT VISIT: 1 Visit                     Kaj Vasil, PT, GCS 06/04/24,2:46 PM

## 2024-06-04 NOTE — Progress Notes (Signed)
 Subjective: 1 Day Post-Op Procedure(s) (LRB): SECONDARY CLOSURE OF WOUND, Polyethylene Exchange (Right) Patient reports pain as mild and moderate.   Patient is well, and has had no acute complaints or problems Denies any CP, SOB, N/V, fevers or chills We will start therapy today.  Plan is to go Home after hospital stay.  Objective: Vital signs in last 24 hours: Temp:  [96.8 F (36 C)-98.4 F (36.9 C)] 98.2 F (36.8 C) (10/16 0831) Pulse Rate:  [70-96] 84 (10/16 0831) Resp:  [8-24] 15 (10/16 0831) BP: (96-146)/(57-90) 138/76 (10/16 0831) SpO2:  [96 %-100 %] 99 % (10/16 0831) Weight:  [124.7 kg] 124.7 kg (10/15 1306)  Intake/Output from previous day:  Intake/Output Summary (Last 24 hours) at 06/04/2024 0838 Last data filed at 06/04/2024 0524 Gross per 24 hour  Intake 1000 ml  Output 225 ml  Net 775 ml    Intake/Output this shift: No intake/output data recorded.  Labs: Recent Labs    06/04/24 0555  HGB 10.4*   Recent Labs    06/04/24 0555  WBC 8.0  RBC 3.64*  HCT 32.0*  PLT 475*   Recent Labs    06/04/24 0555  NA 137  K 3.6  CL 102  CO2 21*  BUN 22  CREATININE 0.95  GLUCOSE 164*  CALCIUM 9.9   No results for input(s): LABPT, INR in the last 72 hours.  EXAM General - Patient is Alert, Appropriate, and Oriented Extremity - Neurologically intact Neurovascular intact Sensation intact distally Intact pulses distally Dorsiflexion/Plantar flexion intact No cellulitis present Compartment soft Dressing - Wound vac and hemovac intact with good suction Motor Function - intact, moving foot and toes well on exam. JP Drain remains in place  Past Medical History:  Diagnosis Date   GERD (gastroesophageal reflux disease)    Headache    History of bronchitis    Hypertension    Morbid obesity (HCC)    Pre-diabetes    Primary osteoarthritis of right knee     Assessment/Plan: 1 Day Post-Op Procedure(s) (LRB): SECONDARY CLOSURE OF WOUND, Polyethylene  Exchange (Right) Principal Problem:   Status post revision of total knee replacement, right  Estimated body mass index is 43.07 kg/m as calculated from the following:   Height as of this encounter: 5' 7 (1.702 m).   Weight as of this encounter: 124.7 kg. Advance diet Up with therapy  Patient will continue to work with physical therapy   Discussed with the patient continuing ice  Wound vac in place Hemovac in place with minimal output, will look to remove later today or tomorrow morning  VSS and Cultures pending Continue on Vancomycin per ID, continue to follow along with ID recommendations  Pain well controlled  Weight-Bearing as tolerated to right leg, remain in knee immobilizer  DVT prophylaxis: Eliquis  Patient will follow-up with [facility] orthopedics in 2 weeks for staple removal and reevaluation  Fonda Koyanagi, PA-C [facility] 06/04/2024, 8:38 AM

## 2024-06-04 NOTE — Evaluation (Signed)
 Physical Therapy Evaluation Patient Details Name: Sarabi Sockwell MRN: [id number] DOB: 24-Mar-1956 Today's Date: 06/04/2024  History of Present Illness  Lizmarie Witters is a 68 y.o. female admitted on 06/03/2024 with wound infection.  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 Edie.  Clinical Impression  Patient received in bed, she is agreeable to PT session. Patient reports 10/10 pain in right knee with movement. She required min A for bed mobility and sit to stand from elevated bed. Patient is able to ambulate 30 feet with RW and supervision. She will continue to benefit from skilled PT to improve functional mobility, rom and strength.           If plan is discharge home, recommend the following: A little help with walking and/or transfers;A little help with bathing/dressing/bathroom;Assist for transportation;Help with stairs or ramp for entrance   Can travel by private vehicle    yes    Equipment Recommendations None recommended by PT  Recommendations for Other Services       Functional Status Assessment Patient has had a recent decline in their functional status and demonstrates the ability to make significant improvements in function in a reasonable and predictable amount of time.     Precautions / Restrictions Precautions Precautions: Fall Recall of Precautions/Restrictions: Intact Required Braces or Orthoses: Knee Immobilizer - Right Knee Immobilizer - Right: On at all times;Other (comment) (except with PT) Restrictions Weight Bearing Restrictions Per Provider Order: Yes RLE Weight Bearing Per Provider Order: Weight bearing as tolerated      Mobility  Bed Mobility Overal bed mobility: Needs Assistance Bed Mobility: Supine to Sit     Supine to sit: Contact guard, Used rails, HOB elevated          Transfers Overall transfer level: Needs assistance Equipment used: Rolling walker (2 wheels) Transfers: Sit to/from Stand Sit to Stand: From elevated surface, Min  assist           General transfer comment: cues and assist to power up neeeded    Ambulation/Gait Ambulation/Gait assistance: Contact guard assist, Supervision Gait Distance (Feet): 30 Feet Assistive device: Rolling walker (2 wheels) Gait Pattern/deviations: Step-to pattern, Decreased step length - right, Decreased step length - left, Decreased weight shift to right, Trunk flexed Gait velocity: decr     General Gait Details: pain limited ambulation. Safe use of walker and proper sequencing demonstrated.  Stairs            Wheelchair Mobility     Tilt Bed    Modified Rankin (Stroke Patients Only)       Balance Overall balance assessment: Modified Independent                                           Pertinent Vitals/Pain Pain Assessment Pain Assessment: 0-10 Pain Score: 10-Worst pain ever Pain Location: R knee Pain Descriptors / Indicators: Discomfort, Grimacing, Guarding Pain Intervention(s): Monitored during session, Premedicated before session, Repositioned    Home Living Family/patient expects to be discharged to:: Private residence Living Arrangements: Alone Available Help at Discharge: Family;Available 24 hours/day Type of Home: House Home Access: Stairs to enter Entrance Stairs-Rails: Right;Left;Can reach both Entrance Stairs-Number of Steps: 5   Home Layout: One level Home Equipment: Agricultural consultant (2 wheels);BSC/3in1      Prior Function Prior Level of Function : Independent/Modified Independent;Driving  Mobility Comments: Modified independent ambulating with quad cane; no recent falls reported. ADLs Comments: Ind and working full time     Extremity/Trunk Assessment   Upper Extremity Assessment Upper Extremity Assessment: Overall WFL for tasks assessed    Lower Extremity Assessment Lower Extremity Assessment: RLE deficits/detail RLE: Unable to fully assess due to pain RLE Coordination: decreased  gross motor    Cervical / Trunk Assessment Cervical / Trunk Assessment: Normal  Communication   Communication Communication: No apparent difficulties    Cognition Arousal: Alert Behavior During Therapy: WFL for tasks assessed/performed   PT - Cognitive impairments: No apparent impairments                         Following commands: Intact       Cueing Cueing Techniques: Verbal cues     General Comments      Exercises     Assessment/Plan    PT Assessment Patient needs continued PT services  PT Problem List Decreased strength;Decreased range of motion;Decreased activity tolerance;Decreased balance;Decreased mobility;Pain;Obesity;Decreased skin integrity       PT Treatment Interventions DME instruction;Gait training;Stair training;Functional mobility training;Therapeutic activities;Therapeutic exercise;Patient/family education    PT Goals (Current goals can be found in the Care Plan section)  Acute Rehab PT Goals Patient Stated Goal: return home, improve pain PT Goal Formulation: With patient Time For Goal Achievement: 06/11/24 Potential to Achieve Goals: Good    Frequency BID     Co-evaluation               AM-PAC PT 6 Clicks Mobility  Outcome Measure Help needed turning from your back to your side while in a flat bed without using bedrails?: A Little Help needed moving from lying on your back to sitting on the side of a flat bed without using bedrails?: A Little Help needed moving to and from a bed to a chair (including a wheelchair)?: A Little Help needed standing up from a chair using your arms (e.g., wheelchair or bedside chair)?: A Little Help needed to walk in hospital room?: A Little Help needed climbing 3-5 steps with a railing? : A Little 6 Click Score: 18    End of Session   Activity Tolerance: Patient limited by pain Patient left: in chair;with call bell/phone within reach Nurse Communication: Mobility status PT Visit  Diagnosis: Other abnormalities of gait and mobility (R26.89);Difficulty in walking, not elsewhere classified (R26.2);Pain Pain - Right/Left: Right Pain - part of body: Knee    Time: 0921-0945 PT Time Calculation (min) (ACUTE ONLY): 24 min   Charges:   PT Evaluation $PT Eval Moderate Complexity: 1 Mod PT Treatments $Gait Training: 8-22 mins PT General Charges $$ ACUTE PT VISIT: 1 Visit         Ariauna Farabee, PT, GCS 06/04/24,10:04 AM

## 2024-06-04 NOTE — Plan of Care (Signed)

## 2024-06-04 NOTE — Consult Note (Signed)
 Pharmacy Antibiotic Note  Brandi Wiggins is a 68 y.o. female admitted on 06/03/2024 with wound infection.  Pharmacy has been consulted for cefepime and vancomycin dosing. Afeb. S/p closure of wound of right knee. Cultures obtained.   Plan: Initiate vancomycin 1500mg  IV q24H eAUC 480, Cmax 34.4, Cmin 11 Scr 0.95, IBW, Vd 0.5 L/kg (BMI 43) Continue cefepime 2g IV q8H Monitor renal function and applicable culture results daily  Height: 5' 7 (170.2 cm) Weight: 124.7 kg (275 lb) IBW/kg (Calculated) : 61.6  Temp (24hrs), Avg:97.8 F (36.6 C), Min:96.8 F (36 C), Max:98.4 F (36.9 C)  Recent Labs  Lab 06/04/24 0555  WBC 8.0  CREATININE 0.95    Estimated Creatinine Clearance: 77.7 mL/min (by C-G formula based on SCr of 0.95 mg/dL).    No Known Allergies  Thank you for allowing pharmacy to be a part of this patient's care.  Will M. Lenon, PharmD, BCPS Clinical Pharmacist 06/04/2024 7:51 AM

## 2024-06-04 NOTE — Plan of Care (Signed)
 Documented

## 2024-06-05 ENCOUNTER — Other Ambulatory Visit: Payer: Self-pay

## 2024-06-05 DIAGNOSIS — Z96651 Presence of right artificial knee joint: Secondary | ICD-10-CM | POA: Diagnosis not present

## 2024-06-05 DIAGNOSIS — T8453XA Infection and inflammatory reaction due to internal right knee prosthesis, initial encounter: Secondary | ICD-10-CM | POA: Diagnosis not present

## 2024-06-05 DIAGNOSIS — M96842 Postprocedural seroma of a musculoskeletal structure following a musculoskeletal system procedure: Secondary | ICD-10-CM | POA: Diagnosis not present

## 2024-06-05 LAB — CBC
HCT: 29 % — ABNORMAL LOW (ref 36.0–46.0)
Hemoglobin: 9.3 g/dL — ABNORMAL LOW (ref 12.0–15.0)
MCH: 28.4 pg (ref 26.0–34.0)
MCHC: 32.1 g/dL (ref 30.0–36.0)
MCV: 88.7 fL (ref 80.0–100.0)
Platelets: 421 K/uL — ABNORMAL HIGH (ref 150–400)
RBC: 3.27 MIL/uL — ABNORMAL LOW (ref 3.87–5.11)
RDW: 15.4 % (ref 11.5–15.5)
WBC: 11.1 K/uL — ABNORMAL HIGH (ref 4.0–10.5)
nRBC: 0 % (ref 0.0–0.2)

## 2024-06-05 LAB — CK: Total CK: 70 U/L (ref 38–234)

## 2024-06-05 LAB — CREATININE, SERUM
Creatinine, Ser: 0.76 mg/dL (ref 0.44–1.00)
GFR, Estimated: >60 mL/min (ref >60)

## 2024-06-05 MED ORDER — OXYCODONE HCL 5 MG PO TABS: 5.0000 mg | ORAL_TABLET | ORAL | 0 refills | Status: DC | PRN

## 2024-06-05 MED ORDER — TRAMADOL HCL 50 MG PO TABS: 50.0000 mg | ORAL_TABLET | Freq: Every day | ORAL | 0 refills | Status: DC

## 2024-06-05 MED ORDER — SODIUM CHLORIDE 0.9 % IV SOLN
2.0000 g | Freq: Every day | INTRAVENOUS | Status: DC
  Administered 2024-06-05 – 2024-06-06 (×2): 2 g via INTRAVENOUS
  Filled 2024-06-05 (×2): qty 20

## 2024-06-05 MED ORDER — CEFTRIAXONE IV (FOR PTA / DISCHARGE USE ONLY): 2.0000 g | INTRAVENOUS | 0 refills | Status: AC

## 2024-06-05 MED ORDER — VANCOMYCIN HCL 1750 MG/350ML IV SOLN: 1750.0000 mg | INTRAVENOUS | Status: DC

## 2024-06-05 MED ORDER — DAPTOMYCIN-SODIUM CHLORIDE 700-0.9 MG/100ML-% IV SOLN
8.0000 mg/kg | Freq: Every day | INTRAVENOUS | Status: DC
  Administered 2024-06-05 – 2024-06-06 (×2): 700 mg via INTRAVENOUS
  Filled 2024-06-05 (×2): qty 100

## 2024-06-05 MED ORDER — DAPTOMYCIN IV (FOR PTA / DISCHARGE USE ONLY): 700.0000 mg | INTRAVENOUS | 0 refills | Status: AC

## 2024-06-05 NOTE — Progress Notes (Signed)
 PICC order received. Primary RN made aware that PICC will not be placed tonight.

## 2024-06-05 NOTE — Plan of Care (Signed)

## 2024-06-05 NOTE — TOC Transition Note (Signed)
 Transition of Care [facility]) - Discharge Note   Patient Details  Name: Latina Frank MRN: [id number] Date of Birth: 1956-07-21  Transition of Care [facility]) CM/SW Contact:  [place] L Lucillie Kiesel, LCSW Phone Number: 06/05/2024, 11:05 AM   Clinical Narrative:     Home Health has been set up with [facility] which is the preferred agency for the surgeon's office.   No DME needs. TOC signing.         Patient Goals and CMS Choice            Discharge Placement                       Discharge Plan and Services Additional resources added to the After Visit Summary for                                       Social Drivers of Health (SDOH) Interventions SDOH Screenings   Food Insecurity: No Food Insecurity (06/03/2024)  Housing: Low Risk  (06/03/2024)  Transportation Needs: No Transportation Needs (06/03/2024)  Utilities: Not At Risk (06/03/2024)  Financial Resource Strain: Low Risk  (05/06/2024)   Received from [facility]  Social Connections: Unknown (06/03/2024)  Tobacco Use: High Risk (06/03/2024)     Readmission Risk Interventions     No data to display

## 2024-06-05 NOTE — Progress Notes (Signed)
 PHARMACY CONSULT NOTE FOR:  OUTPATIENT  PARENTERAL ANTIBIOTIC THERAPY (OPAT)  Indication: knee prosthetic joint infection Regimen:  Daptomycin 700mg  IV q24h Ceftriaxone 2gm IV q24h End date: 07/02/2024  Labs - Once weekly:  CBC/D, CMP, CPK, ESR and CRP Fax weekly lab results  promptly to [phone] and [phone] Please leave PICC in place until doctor has seen patient or been notified Call [phone] with critical values or questions  IV antibiotic discharge orders are pended. To discharging provider:  please sign these orders via discharge navigator,  Select New Orders & click on the button choice - Manage This Unsigned Work.     Thank you for allowing pharmacy to be a part of this patient's care.  Sahiba Granholm, PharmD, BCPS, BCIDP Work Cell: [phone] 06/05/2024 3:13 PM

## 2024-06-05 NOTE — Discharge Instructions (Signed)
 Orthopedic discharge instructions: Hold off on showering until follow up outpatient with Orthopedics Apply ice frequently to knee or use [employer]. Start Eliquis  1 tablet (2.5 mg) twice daily on Wednesday, 04/03/2022, for 2 weeks, then take aspirin  325 mg twice daily for 4 weeks. Take pain medication as prescribed when needed.  Continue on IV antibiotics per ID recommendations and plan to follow up with ID outpatient May supplement with ES Tylenol  if necessary. May weight-bear as tolerated on right leg in knee immobilizer- use walker for balance and support. Follow-up in 7-10 days or as scheduled.  Home Health is set up with [facility]. You will receive a call 24-48 hours after discharge.

## 2024-06-05 NOTE — Progress Notes (Signed)
 Physical Therapy Treatment Patient Details Name: Brandi Wiggins MRN: [id number] DOB: 18-Nov-1955 Today's Date: 06/05/2024   History of Present Illness Brandi Wiggins is a 68 y.o. female admitted on 06/03/2024 with wound infection.  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 Edie.    PT Comments  Patient received in wheelchair, just arrived to room on new unit. She is agreeable to PT session. Patient requires supervision for all mobility. Cues for safety, cues for exercises. She ambulated 200 feet with RW. Patient will continue to benefit from skilled PT to improve endurance and strength.     If plan is discharge home, recommend the following: A little help with walking and/or transfers;A little help with bathing/dressing/bathroom;Assist for transportation;Help with stairs or ramp for entrance   Can travel by private vehicle      yes  Equipment Recommendations  None recommended by PT    Recommendations for Other Services       Precautions / Restrictions Precautions Precautions: Fall Recall of Precautions/Restrictions: Intact Required Braces or Orthoses: Knee Immobilizer - Right Knee Immobilizer - Right: On at all times;Other (comment) Restrictions Weight Bearing Restrictions Per Provider Order: Yes RLE Weight Bearing Per Provider Order: Weight bearing as tolerated     Mobility  Bed Mobility Overal bed mobility: Modified Independent Bed Mobility: Supine to Sit     Supine to sit: Modified independent (Device/Increase time) Sit to supine: Supervision        Transfers Overall transfer level: Modified independent Equipment used: Rolling walker (2 wheels) Transfers: Sit to/from Stand Sit to Stand: Supervision                Ambulation/Gait Ambulation/Gait assistance: Supervision Gait Distance (Feet): 200 Feet Assistive device: Rolling walker (2 wheels) Gait Pattern/deviations: Step-through pattern, Decreased step length - right, Decreased step length - left,  Decreased stride length, Trunk flexed Gait velocity: decr     General Gait Details: cues needed for safe walker use expecially with turning.   Stairs Stairs: Yes Stairs assistance: Supervision Stair Management: One rail Right, One rail Left, Step to pattern Number of Stairs: 4     Wheelchair Mobility     Tilt Bed    Modified Rankin (Stroke Patients Only)       Balance Overall balance assessment: Modified Independent                                          Communication Communication Communication: No apparent difficulties  Cognition Arousal: Alert Behavior During Therapy: WFL for tasks assessed/performed   PT - Cognitive impairments: No apparent impairments                         Following commands: Intact      Cueing Cueing Techniques: Verbal cues  Exercises Total Joint Exercises Ankle Circles/Pumps: AROM Heel Slides: AROM, Right, 5 reps Hip ABduction/ADduction: AROM, Right, 5 reps Straight Leg Raises: AROM, Right, 5 reps Long Arc Quad: AROM, Right, 10 reps Goniometric ROM: 0-80    General Comments        Pertinent Vitals/Pain Pain Assessment Pain Assessment: Faces Pain Score: 7  Faces Pain Scale: Hurts a little bit Pain Location: R knee Pain Descriptors / Indicators: Discomfort, Grimacing, Guarding Pain Intervention(s): Monitored during session, Repositioned    Home Living  Prior Function            PT Goals (current goals can now be found in the care plan section) Acute Rehab PT Goals Patient Stated Goal: return home, improve pain PT Goal Formulation: With patient Time For Goal Achievement: 06/11/24 Potential to Achieve Goals: Good Progress towards PT goals: Progressing toward goals    Frequency    BID      PT Plan      Co-evaluation              AM-PAC PT 6 Clicks Mobility   Outcome Measure  Help needed turning from your back to your side while in a  flat bed without using bedrails?: None Help needed moving from lying on your back to sitting on the side of a flat bed without using bedrails?: None Help needed moving to and from a bed to a chair (including a wheelchair)?: A Little Help needed standing up from a chair using your arms (e.g., wheelchair or bedside chair)?: None Help needed to walk in hospital room?: A Little Help needed climbing 3-5 steps with a railing? : A Little 6 Click Score: 21    End of Session   Activity Tolerance: Patient tolerated treatment well Patient left: in bed;with call bell/phone within reach Nurse Communication: Mobility status PT Visit Diagnosis: Other abnormalities of gait and mobility (R26.89);Difficulty in walking, not elsewhere classified (R26.2);Pain Pain - Right/Left: Right Pain - part of body: Knee     Time: 8444-8386 PT Time Calculation (min) (ACUTE ONLY): 18 min  Charges:    $Therapeutic Exercise: 8-22 mins PT General Charges $$ ACUTE PT VISIT: 1 Visit                     Teigan Manner, PT, GCS 06/05/24,4:16 PM

## 2024-06-05 NOTE — Progress Notes (Signed)
 Physical Therapy Treatment Patient Details Name: Brandi Wiggins MRN: [id number] DOB: 1956-06-03 Today's Date: 06/05/2024   History of Present Illness Brandi Wiggins is a 68 y.o. female admitted on 06/03/2024 with wound infection.  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 Edie.    PT Comments  Patient received in bed, she is ready for PT. Patient is mod I for bed mobility and supervision for sit to stand transfers. Ambulated 200 feet with RW and up/down 4 steps with single rail supervision. She is making good progress and will continue to benefit from skilled PT to improve strength and independence.      If plan is discharge home, recommend the following: A little help with walking and/or transfers;A little help with bathing/dressing/bathroom;Assist for transportation;Help with stairs or ramp for entrance   Can travel by private vehicle      yes  Equipment Recommendations  None recommended by PT    Recommendations for Other Services       Precautions / Restrictions Precautions Precautions: Fall Recall of Precautions/Restrictions: Intact Required Braces or Orthoses: Knee Immobilizer - Right Knee Immobilizer - Right: On at all times;Other (comment) Restrictions Weight Bearing Restrictions Per Provider Order: Yes RLE Weight Bearing Per Provider Order: Weight bearing as tolerated     Mobility  Bed Mobility Overal bed mobility: Modified Independent Bed Mobility: Supine to Sit     Supine to sit: Modified independent (Device/Increase time)          Transfers Overall transfer level: Needs assistance Equipment used: Rolling walker (2 wheels) Transfers: Sit to/from Stand Sit to Stand: Supervision                Ambulation/Gait Ambulation/Gait assistance: Supervision Gait Distance (Feet): 200 Feet Assistive device: Rolling walker (2 wheels) Gait Pattern/deviations: Step-through pattern, Decreased step length - right, Decreased step length - left, Decreased stride  length, Trunk flexed Gait velocity: decr     General Gait Details: cues needed for safe walker use expecially with turning.   Stairs Stairs: Yes Stairs assistance: Supervision Stair Management: One rail Right, One rail Left, Step to pattern Number of Stairs: 4     Wheelchair Mobility     Tilt Bed    Modified Rankin (Stroke Patients Only)       Balance Overall balance assessment: Modified Independent                                          Communication Communication Communication: No apparent difficulties  Cognition Arousal: Alert Behavior During Therapy: WFL for tasks assessed/performed   PT - Cognitive impairments: No apparent impairments                         Following commands: Intact      Cueing Cueing Techniques: Verbal cues  Exercises Total Joint Exercises Long Arc Quad: AROM, Right, 10 reps Goniometric ROM: 0-80    General Comments        Pertinent Vitals/Pain Pain Assessment Pain Assessment: 0-10 Pain Score: 7  Pain Location: R knee Pain Descriptors / Indicators: Discomfort, Grimacing, Guarding Pain Intervention(s): Monitored during session, Repositioned    Home Living                          Prior Function  PT Goals (current goals can now be found in the care plan section) Acute Rehab PT Goals Patient Stated Goal: return home, improve pain PT Goal Formulation: With patient Time For Goal Achievement: 06/11/24 Potential to Achieve Goals: Good Progress towards PT goals: Progressing toward goals    Frequency    BID      PT Plan      Co-evaluation              AM-PAC PT 6 Clicks Mobility   Outcome Measure  Help needed turning from your back to your side while in a flat bed without using bedrails?: None Help needed moving from lying on your back to sitting on the side of a flat bed without using bedrails?: None Help needed moving to and from a bed to a chair  (including a wheelchair)?: A Little Help needed standing up from a chair using your arms (e.g., wheelchair or bedside chair)?: A Little Help needed to walk in hospital room?: A Little Help needed climbing 3-5 steps with a railing? : A Little 6 Click Score: 20    End of Session   Activity Tolerance: Patient tolerated treatment well Patient left: in chair;with call bell/phone within reach Nurse Communication: Mobility status PT Visit Diagnosis: Other abnormalities of gait and mobility (R26.89);Difficulty in walking, not elsewhere classified (R26.2);Pain Pain - Right/Left: Right Pain - part of body: Knee     Time: 9063-9044 PT Time Calculation (min) (ACUTE ONLY): 19 min  Charges:    $Gait Training: 8-22 mins PT General Charges $$ ACUTE PT VISIT: 1 Visit                     Chanise Habeck, PT, GCS 06/05/24,10:12 AM

## 2024-06-05 NOTE — Consult Note (Signed)
 Pharmacy Antibiotic Note  Brandi Wiggins is a 68 y.o. female admitted on 06/03/2024 with wound infection.  Pharmacy has been consulted for cefepime and vancomycin dosing. Afeb. S/p closure of wound of right knee. Cultures obtained.  Update 10/17: Renal function improved. Vancomycin dose increase denoted below. ID following patient.  Plan: Adjust vancomycin to 1750mg  IV q24H eAUC 478, Cmax 37, Cmin 10 Scr 0.8, IBW, Vd 0.5 L/kg (BMI 43) Continue cefepime 2g IV q8H Monitor renal function and culture results daily  Height: 5' 7 (170.2 cm) Weight: 124.7 kg (275 lb) IBW/kg (Calculated) : 61.6  Temp (24hrs), Avg:97.9 F (36.6 C), Min:97.4 F (36.3 C), Max:98.3 F (36.8 C)  Recent Labs  Lab 06/04/24 0555 06/05/24 0616  WBC 8.0 11.1*  CREATININE 0.95 0.76    Estimated Creatinine Clearance: 92.2 mL/min (by C-G formula based on SCr of 0.76 mg/dL).    No Known Allergies  Cultures: 10/15 first wound cx: NG2D 10/15 second wound cx: NG2D  Thank you for allowing pharmacy to be a part of this patient's care.  Will M. Lenon, PharmD, BCPS Clinical Pharmacist 06/05/2024 11:50 AM

## 2024-06-05 NOTE — Discharge Summary (Signed)
 Physician Discharge Summary  Subjective: 3 Days Post-Op Procedure(s) (LRB): SECONDARY CLOSURE OF WOUND, Polyethylene Exchange (Right) Patient reports pain as mild.   Patient is well, and has had no acute complaints or problems Denies any CP, SOB, ABD pain. We will continue therapy today.  Patient is ready to go home  Physician Discharge Summary  Patient ID: Brandi Wiggins MRN: [id number] DOB/AGE: 1956-07-11 68 y.o.  Admit date: 06/03/2024 Discharge date: 06/06/2024  Admission Diagnoses:  Infection of right knee, s/p right total knee arthroplasty  Discharge Diagnoses:  Principal Problem:   Status post revision of total knee replacement, right   Discharged Condition: good  Hospital Course: Patient presented to the hospital on 06/03/2024 for an elective irrigation and debridement with poly exchange of right knee. Patient was given 1g of TXA and 2g of Ancef  prior to the procedure. she tolerated the procedure well without any complications. See procedural note below for details. Postoperatively, the patient did well. she was able to pass PT protocols. JP drain was removed without any difficulty and was intact. she was able to void her bladder without any difficulty. Physical exam was unremarkable. Patient has had cultures that have thus far been negative. PICC line placed (for planned outpatient IV abx). she denies any SOB, CP, N/V, fevers or chills. Vital signs are stable. Patient is stable to discharge home.    Procedure:   Irrigation and debridement with polyethylene exchange and primary closure status post right total knee arthroplasty.   Surgeon:   DOROTHA Reyes Maltos, MD   Assistant:   Lorn Just, RN   Anesthesia:   General LMA   Findings:   As above.   Complications:   None   Fluids:   900 cc crystalloid   EBL:   200 cc   UOP:   None   TT:   86 at 325 mmHg   Drains:   Prevena x 1, Hemovac x 1   Closure:   Staples   Implants:   [employer] 10 mm medial  congruent polyethylene  Treatments: IV antibiotics and PICC line placement  Discharge Exam: Blood pressure 139/74, pulse 82, temperature 98 F (36.7 C), temperature source Oral, resp. rate 17, height 5' 7 (1.702 m), weight 124.7 kg, SpO2 94%.   Disposition: Discharge disposition: 01-Home or Self Care     home  Discharge Instructions     [facility] pharmacist to adjust dose for Vancomycin, Aminoglycosides and other anti-infective therapies as requested by physician.   Complete by: As directed    [facility] to provide Cath Flo 2mg    Complete by: As directed    Administer for PICC line occlusion and as ordered by physician for other access device issues.   Anaphylaxis Kit: Provided to treat any anaphylactic reaction to the medication being provided to the patient if First Dose or when requested by physician   Complete by: As directed    Epinephrine  1mg /ml vial / amp: Administer 0.3mg  (0.68ml) subcutaneously once for moderate to severe anaphylaxis, nurse to call physician and pharmacy when reaction occurs and call 911 if needed for immediate care   Diphenhydramine  50mg /ml IV vial: Administer 25-50mg  IV/IM PRN for first dose reaction, rash, itching, mild reaction, nurse to call physician and pharmacy when reaction occurs   Sodium Chloride  0.9% NS 500ml IV: Administer if needed for hypovolemic blood pressure drop or as ordered by physician after call to physician with anaphylactic reaction   Change dressing on IV access line weekly and  PRN   Complete by: As directed    Flush IV access with Sodium Chloride  0.9% and Heparin 10 units/ml or 100 units/ml   Complete by: As directed    Home infusion instructions - [facility]   Complete by: As directed    Instructions: Flush IV access with Sodium Chloride  0.9% and Heparin 10units/ml or 100units/ml   Change dressing on IV access line: Weekly and PRN   Instructions Cath Flo 2mg : Administer for PICC Line  occlusion and as ordered by physician for other access device   [facility] pharmacist to adjust dose for: Vancomycin, Aminoglycosides and other anti-infective therapies as requested by physician   Method of administration may be changed at the discretion of home infusion pharmacist based upon assessment of the patient and/or caregiver's ability to self-administer the medication ordered   Complete by: As directed       Allergies as of 06/06/2024   No Known Allergies      Medication List     TAKE these medications    albuterol  108 (90 Base) MCG/ACT inhaler Commonly known as: VENTOLIN  HFA Inhale 2 puffs into the lungs every 4 (four) hours as needed for wheezing or shortness of breath.   apixaban  2.5 MG Tabs tablet Commonly known as: ELIQUIS  Take 1 tablet (2.5 mg total) by mouth 2 (two) times daily.   cefTRIAXone IVPB Commonly known as: ROCEPHIN Inject 2 g into the vein daily for 25 days. Indication:  knee prosthetic joint infection First Dose: Yes Last Day of Therapy:  07/02/2024 Labs - Once weekly:  CBC/D, CMP, CPK, ESR and CRP Fax weekly lab results  promptly to [phone] and [phone] Method of administration: IV Push Method of administration may be changed at the discretion of home infusion pharmacist based upon assessment of the patient and/or caregiver's ability to self-administer the medication ordered. Please leave PICC in place until doctor has seen patient or been notified Call [phone] with critical values or questions Start taking on: June 07, 2024   daptomycin IVPB Commonly known as: CUBICIN Inject 700 mg into the vein daily for 25 days. Indication:  knee prosthetic joint infection First Dose: Yes Last Day of Therapy:  07/02/2024 Labs - Once weekly:  CBC/D, CMP, CPK, ESR and CRP Fax weekly lab results  promptly to [phone] and [phone] Method of administration: IV Push Method of administration may be changed at the  discretion of home infusion pharmacist based upon assessment of the patient and/or caregiver's ability to self-administer the medication ordered. Please leave PICC in place until doctor has seen patient or been notified Call [phone] with critical values or questions Start taking on: June 07, 2024   ketorolac  0.5 % ophthalmic solution Commonly known as: ACULAR  Place 1 drop into the left eye every 6 (six) hours.   losartan -hydrochlorothiazide  100-25 MG tablet Commonly known as: HYZAAR Take 1 tablet by mouth daily.   meloxicam 15 MG tablet Commonly known as: MOBIC Take 15 mg by mouth daily.   ondansetron  4 MG tablet Commonly known as: ZOFRAN  Take 1 tablet (4 mg total) by mouth every 6 (six) hours as needed for nausea.   oxyCODONE  5 MG immediate release tablet Commonly known as: Oxy IR/ROXICODONE  Take 1-2 tablets (5-10 mg total) by mouth every 4 (four) hours as needed for moderate pain (pain score 4-6).   phentermine  37.5 MG tablet Commonly known as: ADIPEX-P  Take 37.5 mg by mouth daily.   topiramate  25 MG tablet Commonly known as: TOPAMAX  Take  25 mg by mouth daily.   traMADol 50 MG tablet Commonly known as: ULTRAM Take 1 tablet (50 mg total) by mouth daily.               Discharge Care Instructions  (From admission, onward)           Start     Ordered   06/05/24 0000  Change dressing on IV access line weekly and PRN  (Home infusion instructions - [facility] )        06/05/24 1624             Signed: Sidra Koyanagi 06/06/2024, 9:16 AM   Objective: Vital signs in last 24 hours: Temp:  [98 F (36.7 C)-99 F (37.2 C)] 98 F (36.7 C) (10/18 0749) Pulse Rate:  [79-90] 82 (10/18 0749) Resp:  [16-18] 17 (10/18 0749) BP: (98-139)/(50-74) 139/74 (10/18 0749) SpO2:  [94 %-99 %] 94 % (10/18 0749)  Intake/Output from previous day: No intake or output data in the 24 hours ending 06/06/24 0916  Intake/Output this shift: No  intake/output data recorded.  Labs: Recent Labs    06/04/24 0555 06/05/24 0616  HGB 10.4* 9.3*   Recent Labs    06/04/24 0555 06/05/24 0616  WBC 8.0 11.1*  RBC 3.64* 3.27*  HCT 32.0* 29.0*  PLT 475* 421*   Recent Labs    06/04/24 0555 06/05/24 0616 06/06/24 0427  NA 137  --   --   K 3.6  --   --   CL 102  --   --   CO2 21*  --   --   BUN 22  --   --   CREATININE 0.95 0.76 0.68  GLUCOSE 164*  --   --   CALCIUM 9.9  --   --    No results for input(s): LABPT, INR in the last 72 hours.  EXAM: General - Patient is Alert, Appropriate, and Oriented Extremity - Neurovascular intact Sensation intact distally Intact pulses distally Dorsiflexion/Plantar flexion intact Dressing - dressing C/D/I and no drainage Prevena intact without drainage.  Knee immobilizer intact Motor Function - intact, moving foot and toes well on exam.   Assessment/Plan: 3 Days Post-Op Procedure(s) (LRB): SECONDARY CLOSURE OF WOUND, Polyethylene Exchange (Right) Procedure(s) (LRB): SECONDARY CLOSURE OF WOUND, Polyethylene Exchange (Right) Past Medical History:  Diagnosis Date   GERD (gastroesophageal reflux disease)    Headache    History of bronchitis    Hypertension    Morbid obesity (HCC)    Pre-diabetes    Primary osteoarthritis of right knee    Principal Problem:   Status post revision of total knee replacement, right  Estimated body mass index is 43.07 kg/m as calculated from the following:   Height as of this encounter: 5' 7 (1.702 m).   Weight as of this encounter: 124.7 kg.  Diet - Regular diet Follow up - in 2 weeks Activity - WBAT in knee immobilizer Disposition - Home Condition Upon Discharge - Good DVT Prophylaxis - Eliquis  and TED Hose  Fonda CHARLENA Koyanagi, PA-C Orthopaedic Surgery 06/06/2024, 9:16 AM

## 2024-06-05 NOTE — Progress Notes (Signed)
   Subjective: 2 Days Post-Op Procedure(s) (LRB): SECONDARY CLOSURE OF WOUND, Polyethylene Exchange (Right) Patient reports pain as mild.   Patient is well, and has had no acute complaints or problems Denies any CP, SOB, ABD pain. We will continue therapy today.   Objective: Vital signs in last 24 hours: Temp:  [97.4 F (36.3 C)-98.2 F (36.8 C)] 98.1 F (36.7 C) (10/17 0405) Pulse Rate:  [77-102] 77 (10/17 0405) Resp:  [13-16] 13 (10/17 0405) BP: (133-173)/(74-90) 154/90 (10/17 0405) SpO2:  [95 %-100 %] 100 % (10/17 0405)  Intake/Output from previous day: 10/16 0701 - 10/17 0700 In: 1200 [I.V.:1000; IV Piggyback:200] Out: 300 [Urine:300] Intake/Output this shift: No intake/output data recorded.  Recent Labs    06/04/24 0555 06/05/24 0616  HGB 10.4* 9.3*   Recent Labs    06/04/24 0555 06/05/24 0616  WBC 8.0 11.1*  RBC 3.64* 3.27*  HCT 32.0* 29.0*  PLT 475* 421*   Recent Labs    06/04/24 0555 06/05/24 0616  NA 137  --   K 3.6  --   CL 102  --   CO2 21*  --   BUN 22  --   CREATININE 0.95 0.76  GLUCOSE 164*  --   CALCIUM 9.9  --    No results for input(s): LABPT, INR in the last 72 hours.  EXAM General - Patient is Alert, Appropriate, and Oriented Extremity - Neurovascular intact Sensation intact distally Intact pulses distally Dorsiflexion/Plantar flexion intact Dressing - dressing C/D/I and no drainage Prevena intact without drainage.  Knee immobilizer intact Motor Function - intact, moving foot and toes well on exam.   Past Medical History:  Diagnosis Date   GERD (gastroesophageal reflux disease)    Headache    History of bronchitis    Hypertension    Morbid obesity (HCC)    Pre-diabetes    Primary osteoarthritis of right knee     Assessment/Plan:   2 Days Post-Op Procedure(s) (LRB): SECONDARY CLOSURE OF WOUND, Polyethylene Exchange (Right) Principal Problem:   Status post revision of total knee replacement, right  Estimated body  mass index is 43.07 kg/m as calculated from the following:   Height as of this encounter: 5' 7 (1.702 m).   Weight as of this encounter: 124.7 kg. Advance diet Up with therapy Pain well-controlled Labs and vital signs are stable Cultures pending.  ID following. Continue with IV antibiotics. Care management to assist with discharge    DVT Prophylaxis - TED hose and SCDs Eliquis  Weight-Bearing as tolerated to right leg   T. Medford Amber, PA-C [facility] 06/05/2024, 8:14 AM

## 2024-06-05 NOTE — Plan of Care (Signed)
 See chart

## 2024-06-05 NOTE — Treatment Plan (Signed)
 Diagnosis: Prosthetic joint infection Baseline Creatinine <1    No Known Allergies  OPAT Orders Discharge antibiotics: Daptomycin 8mg /kg 700mg  IV every 24 hours Ceftriaxone 2 grams IV ebery 24 hours Duration:4 weeks Will extend depending on culture result  End Date: 07/02/24  [facility] Care Per Protocol:  Labs weekly while on IV antibiotics: X__ CBC with differential X CK _x_ CMP _x_ CRP _X_ ESR    _X_ Please leave PIC in place until doctor has seen patient or been notified  Fax weekly lab results  promptly to [phone] and [phone]  Clinic Follow Up Appt:06/18/24 at 10.45Am with Dr.Sherard Sutch   Call [phone] with critical values or questions

## 2024-06-05 NOTE — Progress Notes (Signed)
 Date of Admission:  06/03/2024      ID: Brandi Wiggins is a 68 y.o. female  Principal Problem:   Status post revision of total knee replacement, right    Subjective: Doing well ambulating  Medications:   apixaban   2.5 mg Oral BID   docusate sodium   100 mg Oral BID   losartan   100 mg Oral Daily   And   hydrochlorothiazide   25 mg Oral Daily   topiramate   25 mg Oral Daily    Objective: Vital signs in last 24 hours: Patient Vitals for the past 24 hrs:  BP Temp Temp src Pulse Resp SpO2  06/05/24 0823 132/76 98.3 F (36.8 C) Oral -- 14 99 %  06/05/24 0405 (!) 154/90 98.1 F (36.7 C) -- 77 13 100 %  06/04/24 2224 (!) 173/77 (!) 97.4 F (36.3 C) Temporal (!) 102 16 95 %  06/04/24 1536 133/74 97.8 F (36.6 C) Oral -- 16 100 %       PHYSICAL EXAM:  General: Alert, cooperative, no distress, appears stated age.  Head: Normocephalic, without obvious abnormality, atraumatic. Eyes: Conjunctivae clear, anicteric sclerae. Pupils are equal ENT Nares normal. No drainage or sinus tenderness. Lips, mucosa, and tongue normal. No Thrush Neck: Supple, symmetrical, no adenopathy, thyroid: non tender no carotid bruit and no JVD. Back: No CVA tenderness. Lungs: Clear to auscultation bilaterally. No Wheezing or Rhonchi. No rales. Heart: Regular rate and rhythm, no murmur, rub or gallop. Abdomen: Soft, non-tender,not distended. Bowel sounds normal. No masses Extremities: rt leg in brace Skin: No rashes or lesions. Or bruising Lymph: Cervical, supraclavicular normal. Neurologic: Grossly non-focal  Lab Results    Latest Ref Rng & Units 06/05/2024    6:16 AM 06/04/2024    5:55 AM 04/14/2024    1:35 PM  CBC  WBC 4.0 - 10.5 K/uL 11.1  8.0  7.1   Hemoglobin 12.0 - 15.0 g/dL 9.3  89.5  87.3   Hematocrit 36.0 - 46.0 % 29.0  32.0  39.4   Platelets 150 - 400 K/uL 421  475  407        Latest Ref Rng & Units 06/05/2024    6:16 AM 06/04/2024    5:55 AM 04/14/2024    1:35 PM  CMP   Glucose 70 - 99 mg/dL  835  89   BUN 8 - 23 mg/dL  22  26   Creatinine 9.55 - 1.00 mg/dL 9.23  9.04  9.21   Sodium 135 - 145 mmol/L  137  138   Potassium 3.5 - 5.1 mmol/L  3.6  3.5   Chloride 98 - 111 mmol/L  102  105   CO2 22 - 32 mmol/L  21  28   Calcium 8.9 - 10.3 mg/dL  9.9  9.8   Total Protein 6.5 - 8.1 g/dL   6.9   Total Bilirubin 0.0 - 1.2 mg/dL   0.5   Alkaline Phos 38 - 126 U/L   91   AST 15 - 41 U/L   23   ALT 0 - 44 U/L   23       Microbiology: Rt knee culture pending     Assessment/Plan:  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  Cultures have been sent and is pending Patient had taken 10 days of cephalexin  September 2025 and completed it on 05/18/2024 It has been 2 weeks since any antibiotic so so  less of a possibility for the culture to be*I will due to partial treatment She is currently on vancomycin and cefepime Will change her to Dapto and ceftriaxone.  She will be discharged on these 2 antibiotics until the cultures are back and then we can de-escalate accordingly.  The current plan is 4 weeks of IV antibiotics but the duration may change according to the culture result.  While on the IV antibiotic need to follow-up weekly labs She will be followed by me as outpatient  Hypertension on losartan  and hydrochlorothiazide   Anemia  Discussed the management with the patient and Dr. Edie and ID pharmacist  ID will not see her this weekend  Will follow her as outpatient

## 2024-06-05 NOTE — Progress Notes (Incomplete)
   Subjective: 3 Days Post-Op Procedure(s) (LRB): SECONDARY CLOSURE OF WOUND, Polyethylene Exchange (Right) Patient reports pain as mild.   Patient is well, and has had no acute complaints or problems Denies any CP, SOB, ABD pain. We will continue therapy today.  Plan is to discharge home  Objective: Vital signs in last 24 hours: Temp:  [98 F (36.7 C)-99 F (37.2 C)] 98 F (36.7 C) (10/18 0749) Pulse Rate:  [79-90] 82 (10/18 0749) Resp:  [16-18] 17 (10/18 0749) BP: (98-139)/(50-74) 139/74 (10/18 0749) SpO2:  [94 %-99 %] 94 % (10/18 0749)  Intake/Output from previous day: No intake/output data recorded. Intake/Output this shift: No intake/output data recorded.  Recent Labs    06/04/24 0555 06/05/24 0616  HGB 10.4* 9.3*   Recent Labs    06/04/24 0555 06/05/24 0616  WBC 8.0 11.1*  RBC 3.64* 3.27*  HCT 32.0* 29.0*  PLT 475* 421*   Recent Labs    06/04/24 0555 06/05/24 0616 06/06/24 0427  NA 137  --   --   K 3.6  --   --   CL 102  --   --   CO2 21*  --   --   BUN 22  --   --   CREATININE 0.95 0.76 0.68  GLUCOSE 164*  --   --   CALCIUM 9.9  --   --    No results for input(s): LABPT, INR in the last 72 hours.  EXAM General - Patient is Alert, Appropriate, and Oriented Extremity - Neurovascular intact Sensation intact distally Intact pulses distally Dorsiflexion/Plantar flexion intact Dressing - dressing C/D/I and no drainage Prevena intact without drainage.  Knee immobilizer intact Motor Function - intact, moving foot and toes well on exam.   Past Medical History:  Diagnosis Date   GERD (gastroesophageal reflux disease)    Headache    History of bronchitis    Hypertension    Morbid obesity (HCC)    Pre-diabetes    Primary osteoarthritis of right knee     Assessment/Plan:   3 Days Post-Op Procedure(s) (LRB): SECONDARY CLOSURE OF WOUND, Polyethylene Exchange (Right) Principal Problem:   Status post revision of total knee replacement,  right  Estimated body mass index is 43.07 kg/m as calculated from the following:   Height as of this encounter: 5' 7 (1.702 m).   Weight as of this encounter: 124.7 kg. Advance diet Up with therapy Pain well-controlled Labs and vital signs are stable Cultures pending.  ID following. Continue with IV antibiotics. Plan will be for outpatient IV antibiotics per ID: Daptomycin & Ceftriaxone Pending PICC line placement Discharge order in place to go home pending PICC placement  DVT Prophylaxis - TED hose and SCDs Eliquis  Weight-Bearing as tolerated to right leg  Fonda CHARLENA Koyanagi, PA-C [facility] 06/06/2024, 9:13 AM

## 2024-06-06 ENCOUNTER — Other Ambulatory Visit: Payer: Self-pay

## 2024-06-06 DIAGNOSIS — T8453XA Infection and inflammatory reaction due to internal right knee prosthesis, initial encounter: Secondary | ICD-10-CM | POA: Diagnosis not present

## 2024-06-06 LAB — CREATININE, SERUM
Creatinine, Ser: 0.68 mg/dL (ref 0.44–1.00)
GFR, Estimated: >60 mL/min (ref >60)

## 2024-06-06 MED ORDER — SODIUM CHLORIDE 0.9% FLUSH: 10.0000 mL | INTRAVENOUS | Status: DC | PRN

## 2024-06-06 MED ORDER — HEPARIN SOD (PORK) LOCK FLUSH 100 UNIT/ML IV SOLN: 250.0000 [IU] | INTRAVENOUS | Status: DC | PRN

## 2024-06-06 MED ORDER — SODIUM CHLORIDE 0.9% FLUSH: 10.0000 mL | Freq: Two times a day (BID) | INTRAVENOUS | Status: DC

## 2024-06-06 MED ORDER — OXYCODONE HCL 5 MG PO TABS
5.0000 mg | ORAL_TABLET | ORAL | 0 refills | Status: DC | PRN
  Filled 2024-06-06: qty 40, 4d supply, fill #0

## 2024-06-06 MED ORDER — CHLORHEXIDINE GLUCONATE CLOTH 2 % EX PADS: 6.0000 | MEDICATED_PAD | Freq: Every day | CUTANEOUS | Status: DC

## 2024-06-06 MED ORDER — TRAMADOL HCL 50 MG PO TABS
50.0000 mg | ORAL_TABLET | Freq: Every day | ORAL | 0 refills | Status: AC
  Filled 2024-06-06: qty 30, 30d supply, fill #0

## 2024-06-06 NOTE — TOC Progression Note (Signed)
 Transition of Care [facility]) - Progression Note    Patient Details  Name: Brandi Wiggins MRN: [id number] Date of Birth: 1956/05/17  Transition of Care [facility]) CM/SW Contact  Brandi Wiggins, [place] Phone Number: 06/06/2024, 5:11 PM  Clinical Narrative:     CSW received notification from Brandi Wiggins, regarding patient. Brandi Wiggins advised that she is coming to [facility] tonight to teach patient how to administer her IV ABX.   CSW advised that Home Health is set up with [facility]. Brandi Wiggins will coordinate with the liaison, Brandi Wiggins.   No further TOC needs. Patient will discharge home tomorrow.                     Expected Discharge Plan and Services         Expected Discharge Date: 06/06/24                                     Social Drivers of Health (SDOH) Interventions SDOH Screenings   Food Insecurity: No Food Insecurity (06/03/2024)  Housing: Low Risk  (06/03/2024)  Transportation Needs: No Transportation Needs (06/03/2024)  Utilities: Not At Risk (06/03/2024)  Financial Resource Strain: Low Risk  (05/06/2024)   Received from [facility]  Social Connections: Unknown (06/03/2024)  Tobacco Use: High Risk (06/03/2024)    Readmission Risk Interventions     No data to display

## 2024-06-06 NOTE — Progress Notes (Signed)
 Physical Therapy Treatment Patient Details Name: Brandi Wiggins MRN: [id number] DOB: [birth year] Today's Date: 06/06/2024   History of Present Illness Brandi Wiggins is a 68 y.o. female admitted on 06/03/2024 with wound infection.  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 Edie.    PT Comments  Patient received in bed, she is agreeable to PT session. Patient doing well. Bed mobility performed mod I. Patient ambulated into bathroom and around nursing station with supervision and RW. No significant difficulties. She will continue to benefit from skilled PT to improve mobility, strength and rom.      If plan is discharge home, recommend the following: A little help with walking and/or transfers;A little help with bathing/dressing/bathroom;Assist for transportation;Help with stairs or ramp for entrance   Can travel by private vehicle      yes  Equipment Recommendations  None recommended by PT    Recommendations for Other Services       Precautions / Restrictions Precautions Precautions: Fall Recall of Precautions/Restrictions: Intact Required Braces or Orthoses: Knee Immobilizer - Right Knee Immobilizer - Right: On at all times;Other (comment) Restrictions Weight Bearing Restrictions Per Provider Order: Yes RLE Weight Bearing Per Provider Order: Weight bearing as tolerated     Mobility  Bed Mobility Overal bed mobility: Modified Independent Bed Mobility: Supine to Sit     Supine to sit: Modified independent (Device/Increase time)          Transfers Overall transfer level: Modified independent Equipment used: Rolling walker (2 wheels) Transfers: Sit to/from Stand Sit to Stand: Supervision                Ambulation/Gait Ambulation/Gait assistance: Supervision Gait Distance (Feet): 200 Feet Assistive device: Rolling walker (2 wheels) Gait Pattern/deviations: Step-through pattern, Decreased step length - right, Decreased step length - left, Decreased stride  length, Trunk flexed Gait velocity: decr     General Gait Details: cues needed for safe walker use expecially with turning.   Stairs             Wheelchair Mobility     Tilt Bed    Modified Rankin (Stroke Patients Only)       Balance Overall balance assessment: Modified Independent                                          Communication Communication Communication: No apparent difficulties  Cognition Arousal: Alert Behavior During Therapy: WFL for tasks assessed/performed   PT - Cognitive impairments: No apparent impairments                         Following commands: Intact      Cueing Cueing Techniques: Verbal cues  Exercises      General Comments        Pertinent Vitals/Pain Pain Assessment Pain Assessment: Faces Faces Pain Scale: Hurts a little bit Pain Location: R knee Pain Descriptors / Indicators: Discomfort, Grimacing, Guarding Pain Intervention(s): Monitored during session, Repositioned    Home Living                          Prior Function            PT Goals (current goals can now be found in the care plan section) Acute Rehab PT Goals Patient Stated Goal: return home, improve pain PT Goal Formulation:  With patient Time For Goal Achievement: 06/11/24 Potential to Achieve Goals: Good Progress towards PT goals: Progressing toward goals    Frequency    BID      PT Plan      Co-evaluation              AM-PAC PT 6 Clicks Mobility   Outcome Measure  Help needed turning from your back to your side while in a flat bed without using bedrails?: None Help needed moving from lying on your back to sitting on the side of a flat bed without using bedrails?: None Help needed moving to and from a bed to a chair (including a wheelchair)?: A Little Help needed standing up from a chair using your arms (e.g., wheelchair or bedside chair)?: None Help needed to walk in hospital room?: A  Little Help needed climbing 3-5 steps with a railing? : A Little 6 Click Score: 21    End of Session   Activity Tolerance: Patient tolerated treatment well Patient left: in chair;with call bell/phone within reach Nurse Communication: Mobility status PT Visit Diagnosis: Other abnormalities of gait and mobility (R26.89);Difficulty in walking, not elsewhere classified (R26.2);Pain Pain - Right/Left: Right Pain - part of body: Knee     Time: 1040-1054 PT Time Calculation (min) (ACUTE ONLY): 14 min  Charges:    $Therapeutic Exercise: 8-22 mins PT General Charges $$ ACUTE PT VISIT: 1 Visit                     Jeremy Mclamb, PT, GCS 06/06/24,12:22 PM

## 2024-06-06 NOTE — Plan of Care (Signed)

## 2024-06-06 NOTE — Progress Notes (Signed)
 Received call from home infusion nurse. Patient is good to d/c home from their standpoint AFTER receiving 2p and 2p IV abx doses.  Meds should be delivered tonight to her home. HH to start Monday morning. Per home infusion nurse both patient and patient's daughter have received education for home infusion.

## 2024-06-06 NOTE — Progress Notes (Signed)
 PT Cancellation Note  Patient Details Name: Brandi Wiggins MRN: [id number] DOB: September 28, 1955   Cancelled Treatment:    Reason Eval/Treat Not Completed: Patient declined, no reason specified Patient declined pm ambulation, she should be discharged this pm. I did re-adjust her KI as it was too low on her leg.   Ramzey Petrovic 06/06/2024, 3:14 PM

## 2024-06-06 NOTE — Progress Notes (Signed)
 Peripherally Inserted Central Catheter Placement  The IV Nurse has discussed with the patient and/or persons authorized to consent for the patient, the purpose of this procedure and the potential benefits and risks involved with this procedure.  The benefits include less needle sticks, lab draws from the catheter, and the patient may be discharged home with the catheter. Risks include, but not limited to, infection, bleeding, blood clot (thrombus formation), and puncture of an artery; nerve damage and irregular heartbeat and possibility to perform a PICC exchange if needed/ordered by physician.  Alternatives to this procedure were also discussed.  Bard Power PICC patient education guide, fact sheet on infection prevention and patient information card has been provided to patient /or left at bedside.    PICC Placement Documentation  PICC Single Lumen 06/06/24 Right Cephalic 38 cm 1 cm (Active)  Indication for Insertion or Continuance of Line Home intravenous therapies (PICC only) 06/06/24 1233  Exposed Catheter (cm) 1 cm 06/06/24 1233  Site Assessment Clean, Dry, Intact 06/06/24 1233  Line Status Flushed;Saline locked;Blood return noted 06/06/24 1233  Dressing Type Transparent;Securing device 06/06/24 1233  Dressing Status Antimicrobial disc/dressing in place;Clean, Dry, Intact 06/06/24 1233  Line Care Connections checked and tightened 06/06/24 1233  Line Adjustment (NICU/IV Team Only) No 06/06/24 1233  Dressing Intervention New dressing;Adhesive placed at insertion site (IV team only);Adhesive placed around edges of dressing (IV team/ICU RN only) 06/06/24 1233  Dressing Change Due 06/13/24 06/06/24 1233       Brandi Wiggins 06/06/2024, 12:34 PM

## 2024-06-08 LAB — AEROBIC/ANAEROBIC CULTURE W GRAM STAIN (SURGICAL/DEEP WOUND)
Culture: NO GROWTH
Culture: NO GROWTH
Gram Stain: NONE SEEN

## 2024-06-18 ENCOUNTER — Ambulatory Visit: Attending: Infectious Diseases | Admitting: Infectious Diseases

## 2024-06-18 ENCOUNTER — Encounter: Payer: Self-pay | Admitting: Infectious Diseases

## 2024-06-18 VITALS — BP 154/102 | HR 90 | Temp 97.1°F

## 2024-06-18 DIAGNOSIS — I10 Essential (primary) hypertension: Secondary | ICD-10-CM | POA: Insufficient documentation

## 2024-06-18 DIAGNOSIS — S8001XA Contusion of right knee, initial encounter: Secondary | ICD-10-CM | POA: Diagnosis not present

## 2024-06-18 DIAGNOSIS — T8453XA Infection and inflammatory reaction due to internal right knee prosthesis, initial encounter: Secondary | ICD-10-CM | POA: Diagnosis present

## 2024-06-18 DIAGNOSIS — D649 Anemia, unspecified: Secondary | ICD-10-CM | POA: Diagnosis not present

## 2024-06-18 DIAGNOSIS — Z96611 Presence of right artificial shoulder joint: Secondary | ICD-10-CM | POA: Diagnosis not present

## 2024-06-18 DIAGNOSIS — M009 Pyogenic arthritis, unspecified: Secondary | ICD-10-CM

## 2024-06-18 MED ORDER — FLUCONAZOLE 150 MG PO TABS: 150.0000 mg | ORAL_TABLET | ORAL | 0 refills | Status: DC

## 2024-06-18 NOTE — Progress Notes (Unsigned)
 NAME: Brandi Wiggins  DOB: 21-Mar-1956  MRN: [id number]  Date/Time: 06/18/2024 11:49 AM  REQUESTING PROVIDER Subjective:  REASON FOR CONSULT:  ? Brandi Wiggins is a 68 y.o. with a history of   ID   Steroid/immune suppressants/splenectomy/Hardware Recent Procedure Surgery Injections Trauma Sick contacts Travel Antibiotic use Food- raw/exotic Animal bites Tick exposure Water sports Fishing/hunting/animal bird exposure Past Medical History:  Diagnosis Date   GERD (gastroesophageal reflux disease)    Headache    History of bronchitis    Hypertension    Morbid obesity (HCC)    Pre-diabetes    Primary osteoarthritis of right knee     Past Surgical History:  Procedure Laterality Date   burr holes to relieve the pressure     due to head injury   at age of 1 years   REVERSE SHOULDER ARTHROPLASTY Right 04/04/2021   Procedure: REVERSE SHOULDER ARTHROPLASTY;  Surgeon: Edie Norleen PARAS, MD;  Location: [facility] ORS;  Service: Orthopedics;  Laterality: Right;   SECONDARY CLOSURE OF WOUND Right 06/03/2024   Procedure: SECONDARY CLOSURE OF WOUND, Polyethylene Exchange;  Surgeon: Edie Norleen PARAS, MD;  Location: [facility] ORS;  Service: Orthopedics;  Laterality: Right;   SHOULDER SURGERY Left    rotator cuff repair   TOTAL KNEE ARTHROPLASTY Right 04/21/2024   Procedure: ARTHROPLASTY, KNEE, TOTAL;  Surgeon: Edie Norleen PARAS, MD;  Location: [facility] ORS;  Service: Orthopedics;  Laterality: Right;    Social History   Socioeconomic History   Marital status: Legally Separated    Spouse name: Not on file   Number of children: Not on file   Years of education: Not on file   Highest education level: Not on file  Occupational History   Not on file  Tobacco Use   Smoking status: Every Day    Current packs/day: 0.25    Average packs/day: 0.3 packs/day for 0.4 years (0.1 ttl pk-yrs)    Types: Cigarettes    Start date: 01/13/2024   Smokeless tobacco: Never  Vaping Use   Vaping status: Never Used   Substance and Sexual Activity   Alcohol use: Yes    Alcohol/week: 1.0 standard drink of alcohol    Types: 1 Glasses of wine per week    Comment: occa   Drug use: No   Sexual activity: Not on file  Other Topics Concern   Not on file  Social History Narrative   Lives alone. But has two daughters and two sisters support persons.    Social Drivers of Corporate Investment Banker Strain: Low Risk  (06/16/2024)   Received from [facility]   Overall Financial Resource Strain (CARDIA)    Difficulty of Paying Living Expenses: Not hard at all  Food Insecurity: No Food Insecurity (06/16/2024)   Received from [facility]   Hunger Vital Sign    Within the past 12 months, you worried that your food would run out before you got the money to buy more.: Never true    Within the past 12 months, the food you bought just didn't last and you didn't have money to get more.: Never true  Transportation Needs: No Transportation Needs (06/16/2024)   Received from [facility] - Transportation    In the past 12 months, has lack of transportation kept you from medical appointments or from getting medications?: No    Lack of Transportation (Non-Medical): No  Physical Activity: Not on file  Stress: Not on file  Social Connections: Unknown (06/03/2024)   Social Connection and Isolation Panel    Frequency of Communication with Friends and Family: More than three times a week    Frequency of Social Gatherings with Friends and Family: Twice a week    Attends Religious Services: More than 4 times per year    Active Member of [employer] or Organizations: No    Attends Banker Meetings: 1 to 4 times per year    Marital Status: Not on file  Intimate Partner Violence: Patient Declined (06/03/2024)   Humiliation, Afraid, Rape, and Kick questionnaire    Fear of Current or Ex-Partner: Patient declined    Emotionally Abused: Patient declined     Physically Abused: Patient declined    Sexually Abused: Patient declined    Family History  Problem Relation Age of Onset   Cancer Mother    CAD Father    No Known Allergies I? Current Outpatient Medications  Medication Sig Dispense Refill   albuterol  (VENTOLIN  HFA) 108 (90 Base) MCG/ACT inhaler Inhale 2 puffs into the lungs every 4 (four) hours as needed for wheezing or shortness of breath. 18 g 0   cefTRIAXone (ROCEPHIN) IVPB Inject 2 g into the vein daily for 25 days. Indication:  knee prosthetic joint infection First Dose: Yes Last Day of Therapy:  07/02/2024 Labs - Once weekly:  CBC/D, CMP, CPK, ESR and CRP Fax weekly lab results  promptly to [phone] and [phone] Method of administration: IV Push Method of administration may be changed at the discretion of home infusion pharmacist based upon assessment of the patient and/or caregiver's ability to self-administer the medication ordered. Please leave PICC in place until doctor has seen patient or been notified Call [phone] with critical values or questions 26 Units 0   daptomycin (CUBICIN) IVPB Inject 700 mg into the vein daily for 25 days. Indication:  knee prosthetic joint infection First Dose: Yes Last Day of Therapy:  07/02/2024 Labs - Once weekly:  CBC/D, CMP, CPK, ESR and CRP Fax weekly lab results  promptly to [phone] and [phone] Method of administration: IV Push Method of administration may be changed at the discretion of home infusion pharmacist based upon assessment of the patient and/or caregiver's ability to self-administer the medication ordered. Please leave PICC in place until doctor has seen patient or been notified Call [phone] with critical values or questions 26 Units 0   ketorolac  (ACULAR ) 0.5 % ophthalmic solution Place 1 drop into the left eye every 6 (six) hours. 5 mL 0   losartan -hydrochlorothiazide  (HYZAAR) 100-25 MG tablet Take 1 tablet by mouth daily.     meloxicam  (MOBIC) 15 MG tablet Take 15 mg by mouth daily.     traMADol (ULTRAM) 50 MG tablet Take 1 tablet (50 mg total) by mouth daily. 30 tablet 0   No current facility-administered medications for this visit.     Abtx:  Anti-infectives (From admission, onward)    None       REVIEW OF SYSTEMS:  Const: negative fever, negative chills, negative weight loss Eyes: negative diplopia or visual changes, negative eye pain ENT: negative coryza, negative sore throat Resp: negative cough, hemoptysis, dyspnea Cards: negative for chest pain, palpitations, lower extremity edema GU: negative for frequency, dysuria and hematuria GI: Negative for abdominal pain, diarrhea, bleeding, constipation Skin: negative for rash and pruritus Heme: negative for easy bruising and gum/nose bleeding MS: negative for myalgias, arthralgias, back pain and muscle weakness Neurolo:negative for headaches, dizziness, vertigo, memory problems  Psych: negative for feelings of anxiety, depression  Endocrine: negative for thyroid, diabetes Allergy/Immunology- negative for any medication or food allergies ? Pertinent Positives include : Objective:  VITALS:  BP (!) 154/102   Pulse 90   Temp (!) 97.1 F (36.2 C) (Temporal)   SpO2 98%  LDA Foley Central line Other drainage tubes PHYSICAL EXAM:  General: Alert, cooperative, no distress, appears stated age.  Head: Normocephalic, without obvious abnormality, atraumatic. Eyes: Conjunctivae clear, anicteric sclerae. Pupils are equal ENT Nares normal. No drainage or sinus tenderness. Lips, mucosa, and tongue normal. No Thrush Neck: Supple, symmetrical, no adenopathy, thyroid: non tender no carotid bruit and no JVD. Back: No CVA tenderness. Lungs: Clear to auscultation bilaterally. No Wheezing or Rhonchi. No rales. Heart: Regular rate and rhythm, no murmur, rub or gallop. Abdomen: Soft, non-tender,not distended. Bowel sounds normal. No masses Extremities: atraumatic, no  cyanosis. No edema. No clubbing Skin: No rashes or lesions. Or bruising Lymph: Cervical, supraclavicular normal. Neurologic: Grossly non-focal Pertinent Labs Lab Results CBC    Component Value Date/Time   WBC 11.1 (H) 06/05/2024 0616   RBC 3.27 (L) 06/05/2024 0616   HGB 9.3 (L) 06/05/2024 0616   HCT 29.0 (L) 06/05/2024 0616   PLT 421 (H) 06/05/2024 0616   MCV 88.7 06/05/2024 0616   MCH 28.4 06/05/2024 0616   MCHC 32.1 06/05/2024 0616   RDW 15.4 06/05/2024 0616   LYMPHSABS 3.5 04/14/2024 1335   MONOABS 0.7 04/14/2024 1335   EOSABS 0.2 04/14/2024 1335   BASOSABS 0.0 04/14/2024 1335       Latest Ref Rng & Units 06/06/2024    4:27 AM 06/05/2024    6:16 AM 06/04/2024    5:55 AM  CMP  Glucose 70 - 99 mg/dL   835   BUN 8 - 23 mg/dL   22   Creatinine 9.55 - 1.00 mg/dL 9.31  9.23  9.04   Sodium 135 - 145 mmol/L   137   Potassium 3.5 - 5.1 mmol/L   3.6   Chloride 98 - 111 mmol/L   102   CO2 22 - 32 mmol/L   21   Calcium 8.9 - 10.3 mg/dL   9.9       Microbiology: No results found for this or any previous visit (from the past 240 hours).  Lines and Device Date on insertion # of days DC  Central line     Foley     ETT       IMAGING RESULTS: I have personally reviewed the films ? Impression/Recommendation ? ? ? ___I have personally spent  ---minutes involved in face-to-face and non-face-to-face activities for this patient on the day of the visit. Professional time spent includes the following activities: Preparing to see the patient (review of tests), Obtaining and/or reviewing separately obtained history (admission/discharge record), Performing a medically appropriate examination and/or evaluation , Ordering medications/tests/procedures, referring and communicating with other health care professionals, Documenting clinical information in the EMR, Independently interpreting results (not separately reported), Communicating results to the patient/family/caregiver, Counseling  and educating the patient/family/caregiver and Care coordination (not separately reported).    ________________________________________________ Discussed with patient, requesting provider Note:  This document was prepared using Dragon voice recognition software and may include unintentional dictation errors.

## 2024-06-18 NOTE — Patient Instructions (Addendum)
 You came in today for a follow-up on your right knee infection. You mentioned that some staples were removed, but there has been fluid leakage since Sunday. You are currently on IV antibiotics  You also reported persistent swelling in your leg and feet, and a yeast infection. Your blood pressure was elevated today, likely due to stress.  YOUR PLAN:  -RIGHT KNEE PROSTHETIC JOINT INFECTION: A prosthetic joint infection is an infection in the area where you have an artificial joint. You should continue your IV antibiotics until July 02, 2024. We will consult with Dr. Edie about transitioning you to oral antibiotics after your IV treatment, possibly using Levaquin or Augmentin.  -LOWER EXTREMITY EDEMA: Lower extremity edema is swelling in the legs. You should continue to elevate your legs at night to help manage the swelling.  -VULVOVAGINAL CANDIDIASIS: Vulvovaginal candidiasis is a yeast infection in the vaginal area, likely caused by antibiotic use. You have been prescribed fluconazole  150 mg, to be taken as one dose now and one dose weekly for three weeks.  INSTRUCTIONS:  Please follow up with Dr. Edie . Continue to monitor your blood pressure and manage stress as best as you can. If you experience any worsening symptoms or side effects, please contact our office immediately.

## 2024-07-06 ENCOUNTER — Telehealth: Payer: Self-pay

## 2024-07-06 NOTE — Telephone Encounter (Signed)
 Verbal orders given to [facility] with patient's [facility] team to remove patient's picc line per Dr. Fayette.  Chyrl verbalized understanding  PH [phone]

## 2024-07-10 ENCOUNTER — Other Ambulatory Visit: Payer: Self-pay | Admitting: Infectious Diseases

## 2024-07-10 NOTE — Progress Notes (Signed)
 Spoke to [employer] - She has been on Iv antibiotics for presumed PJI ( low suspicion as per Dr.Poggi) VS just superficial wound  with neg cultures and DAIR . she had completed IV antibiotics dapto and ceftriaxone  -4 weeks on 07/02/24 and PICC has been removed She was doing fine after that but the last couple of days has some swelling in the leg- no knots or pain- she has a follow up appt with ortho on Monday at 2 pm- May see her there- Once the knee is assessed we can decide whether she needs oral antibiotics.

## 2024-07-14 ENCOUNTER — Ambulatory Visit (HOSPITAL_BASED_OUTPATIENT_CLINIC_OR_DEPARTMENT_OTHER): Admitting: Infectious Diseases

## 2024-07-14 DIAGNOSIS — T8450XD Infection and inflammatory reaction due to unspecified internal joint prosthesis, subsequent encounter: Secondary | ICD-10-CM

## 2024-07-14 MED ORDER — LEVOFLOXACIN 750 MG PO TABS: 750.0000 mg | ORAL_TABLET | Freq: Every day | ORAL | 1 refills | Status: AC

## 2024-07-14 MED ORDER — CEFADROXIL 500 MG PO CAPS: 500.0000 mg | ORAL_CAPSULE | Freq: Two times a day (BID) | ORAL | 1 refills | Status: DC

## 2024-07-14 NOTE — Progress Notes (Addendum)
 NAME: Brandi Wiggins  DOB: 1956-06-10  MRN: [id number]  Date/Time: 07/14/2024 7:24 PM  - Subjective:  Video visit Pt at home provider in the office Follow up for rt Knee PJI   Was hospitalized between with 06/03/24-06/06/24  Brandi Wiggins is a 68 y.o. with a history of  HTN, rt shoulder arthroplasty recent rt TKA on 04/21/24., following which she had swelling of the leg and serosanguinous drainage after she went home. During her first follow up with the ortho team they saw blisters at the surgical site and placed prevena and put her on keflex  500mg  Q6 for 10 days- No culture was taken . Then she started to have some pain working with PT   On 10/10 culture was sent and 10/13 she saw Dr.poggi who took her for I/D on 06/03/24. As he noted communication of the superficial fluid to the joint , he did deep washout and polyethylene liner exchange and deep cultures were sent Culture was negative Pt was DC on ceftriaxone   2 grams IV every 24 hrs and Dapto 700mg  Q 24 for 4 weeks  which she completed on  07/02/24 Has pain and swellign rt knee but no discharge- Saw Dr.poggi yesterday   Past Medical History:  Diagnosis Date   GERD (gastroesophageal reflux disease)    Headache    History of bronchitis    Hypertension    Morbid obesity (HCC)    Pre-diabetes    Primary osteoarthritis of right knee     Past Surgical History:  Procedure Laterality Date   burr holes to relieve the pressure     due to head injury   at age of 1 years   REVERSE SHOULDER ARTHROPLASTY Right 04/04/2021   Procedure: REVERSE SHOULDER ARTHROPLASTY;  Surgeon: Edie Norleen PARAS, MD;  Location: [facility];  Service: Orthopedics;  Laterality: Right;   SECONDARY CLOSURE OF WOUND Right 06/03/2024   Procedure: SECONDARY CLOSURE OF WOUND, Polyethylene Exchange;  Surgeon: Edie Norleen PARAS, MD;  Location: [facility];  Service: Orthopedics;  Laterality: Right;   SHOULDER SURGERY Left    rotator cuff repair   TOTAL KNEE ARTHROPLASTY Right  04/21/2024   Procedure: ARTHROPLASTY, KNEE, TOTAL;  Surgeon: Edie Norleen PARAS, MD;  Location: [facility];  Service: Orthopedics;  Laterality: Right;    Social History   Socioeconomic History   Marital status: Legally Separated    Spouse name: Not on file   Number of children: Not on file   Years of education: Not on file   Highest education level: Not on file  Occupational History   Not on file  Tobacco Use   Smoking status: Every Day    Current packs/day: 0.25    Average packs/day: 0.3 packs/day for 0.5 years (0.1 ttl pk-yrs)    Types: Cigarettes    Start date: 01/13/2024   Smokeless tobacco: Never  Vaping Use   Vaping status: Never Used  Substance and Sexual Activity   Alcohol use: Yes    Alcohol/week: 1.0 standard drink of alcohol    Types: 1 Glasses of wine per week    Comment: occa   Drug use: No   Sexual activity: Not on file  Other Topics Concern   Not on file  Social History Narrative   Lives alone. But has two daughters and two sisters support persons.    Social Drivers of Corporate Investment Banker Strain: Low Risk  (06/22/2024)   Received from [facility]   Overall Financial Resource  Strain (CARDIA)    Difficulty of Paying Living Expenses: Not hard at all  Food Insecurity: No Food Insecurity (06/22/2024)   Received from [facility]   Hunger Vital Sign    Within the past 12 months, you worried that your food would run out before you got the money to buy more.: Never true    Within the past 12 months, the food you bought just didn't last and you didn't have money to get more.: Never true  Transportation Needs: No Transportation Needs (06/22/2024)   Received from [facility] - Transportation    In the past 12 months, has lack of transportation kept you from medical appointments or from getting medications?: No    Lack of Transportation (Non-Medical): No  Physical Activity: Not on file  Stress: Not on file   Social Connections: Unknown (06/03/2024)   Social Connection and Isolation Panel    Frequency of Communication with Friends and Family: More than three times a week    Frequency of Social Gatherings with Friends and Family: Twice a week    Attends Religious Services: More than 4 times per year    Active Member of [employer] or Organizations: No    Attends Banker Meetings: 1 to 4 times per year    Marital Status: Not on file  Intimate Partner Violence: Patient Declined (06/03/2024)   Humiliation, Afraid, Rape, and Kick questionnaire    Fear of Current or Ex-Partner: Patient declined    Emotionally Abused: Patient declined    Physically Abused: Patient declined    Sexually Abused: Patient declined    Family History  Problem Relation Age of Onset   Cancer Mother    CAD Father    No Known Allergies I? Current Outpatient Medications  Medication Sig Dispense Refill   albuterol  (VENTOLIN  HFA) 108 (90 Base) MCG/ACT inhaler Inhale 2 puffs into the lungs every 4 (four) hours as needed for wheezing or shortness of breath. 18 g 0   fluconazole  (DIFLUCAN ) 150 MG tablet Take 1 tablet (150 mg total) by mouth once a week. 3 tablet 0   ketorolac  (ACULAR ) 0.5 % ophthalmic solution Place 1 drop into the left eye every 6 (six) hours. 5 mL 0   losartan -hydrochlorothiazide  (HYZAAR) 100-25 MG tablet Take 1 tablet by mouth daily.     meloxicam (MOBIC) 15 MG tablet Take 15 mg by mouth daily.     traMADol  (ULTRAM ) 50 MG tablet Take 1 tablet (50 mg total) by mouth daily. 30 tablet 0   No current facility-administered medications for this visit.     Abtx:  Anti-infectives (From admission, onward)    None       REVIEW OF SYSTEMS:  Const: negative fever, negative chills, negative weight loss Eyes: negative diplopia or visual changes, negative eye pain ENT: negative coryza, negative sore throat Resp: negative cough, hemoptysis, dyspnea Cards: negative for chest pain, palpitations, lower  extremity edema GU: negative for frequency, dysuria and hematuria GI: Negative for abdominal pain, diarrhea, bleeding, constipation Skin: negative for rash and pruritus Heme: negative for easy bruising and gum/nose bleeding FD:ejpw/ swelling  rt knee Neurolo:negative for headaches, dizziness, vertigo, memory problems  Psych: negative for feelings of anxiety, depression  Endocrine: negative for thyroid, diabetes Allergy/Immunology- negative for any medication or food allergies  Objective:  Looks well Rt knee examined- no discharge Swelling present ? 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  Cultures have remained negative even after 14 days Patient had taken 10 days of cephalexin  September 2025 before the DAIR and completed it on 05/18/2024 and had polyexchange on 06/03/24. So there was a full 2 week period when she was off antibiotics  She has completed 4 weeks of   Dapto and ceftriaxone .   I discussed with Dr.Poggi and we need to treat this like a PJI and not a superfical surgical site infection as the wound tracked deep and polycapsule was changed So will do levaquin  750mg  and cefadrolxil 1 gram BID for atleast 2 months May need more Qtc in sept 493- Will repeat EKG/labs   Hypertension on losartan  and hydrochlorothiazide    Anemia   Discussed the management with the patient  in detail Explained side effects of antibiotic including diarrhea, rash any myopathy, tendinitis, retc  Total time spent on the video visit 20 min  ________________________________________________ FOLLOW Up with Dr.Fitzgerald  Dec 2rd week-3rd week while I am away

## 2024-07-14 NOTE — Addendum Note (Signed)
 Addended by: FAYETTE BODILY on: 07/14/2024 09:38 PM   Modules accepted: Level of Service

## 2024-08-18 ENCOUNTER — Ambulatory Visit: Attending: Infectious Diseases | Admitting: Infectious Diseases

## 2024-08-18 ENCOUNTER — Encounter: Payer: Self-pay | Admitting: Infectious Diseases

## 2024-08-18 VITALS — BP 140/89 | HR 88 | Temp 97.3°F

## 2024-08-18 DIAGNOSIS — F17210 Nicotine dependence, cigarettes, uncomplicated: Secondary | ICD-10-CM | POA: Insufficient documentation

## 2024-08-18 DIAGNOSIS — I10 Essential (primary) hypertension: Secondary | ICD-10-CM | POA: Diagnosis not present

## 2024-08-18 DIAGNOSIS — B9689 Other specified bacterial agents as the cause of diseases classified elsewhere: Secondary | ICD-10-CM

## 2024-08-18 DIAGNOSIS — Z635 Disruption of family by separation and divorce: Secondary | ICD-10-CM | POA: Diagnosis not present

## 2024-08-18 DIAGNOSIS — T8450XD Infection and inflammatory reaction due to unspecified internal joint prosthesis, subsequent encounter: Secondary | ICD-10-CM

## 2024-08-18 DIAGNOSIS — T8453XD Infection and inflammatory reaction due to internal right knee prosthesis, subsequent encounter: Secondary | ICD-10-CM | POA: Diagnosis present

## 2024-08-18 DIAGNOSIS — D649 Anemia, unspecified: Secondary | ICD-10-CM | POA: Insufficient documentation

## 2024-08-18 DIAGNOSIS — R202 Paresthesia of skin: Secondary | ICD-10-CM | POA: Diagnosis not present

## 2024-08-18 DIAGNOSIS — Z79899 Other long term (current) drug therapy: Secondary | ICD-10-CM | POA: Insufficient documentation

## 2024-08-18 DIAGNOSIS — G629 Polyneuropathy, unspecified: Secondary | ICD-10-CM | POA: Diagnosis not present

## 2024-08-18 DIAGNOSIS — M009 Pyogenic arthritis, unspecified: Secondary | ICD-10-CM | POA: Diagnosis not present

## 2024-08-18 MED ORDER — DOXYCYCLINE HYCLATE 100 MG PO CAPS: 100.0000 mg | ORAL_CAPSULE | Freq: Two times a day (BID) | ORAL | 0 refills | Status: AC

## 2024-08-18 NOTE — Progress Notes (Signed)
 NAME: Brandi Wiggins  DOB: 19-Mar-1956  MRN: [id number]  Date/Time: 08/18/2024 8:52 AM  - Subjective:  Follow up  for rt knee septic arthritis ?  Brandi Wiggins is a 68 y.o. female with a history of  HTN, rt shoulder arthroplasty recent rt TKA on 04/21/24., following which she had swelling of the leg and serosanguinous drainage after she went home. During her first follow up with the ortho team they saw blisters at the surgical site and placed prevena and put her on keflex  500mg  Q6 for 10 days- No culture was taken . Then she started to have some pain working with PT   On 10/10 culture was sent and 10/13 she saw Dr.poggi who took her for I/D on 06/03/24. As he noted communication of the superficial fluid to the joint , he did deep washout and polyethylene liner exchange and deep cultures were sent Culture was negative Pt was DC on ceftriaxone   2 grams IV every 24 hrs and Dapto 700mg  Q 24 for 4 weeks until 07/02/24 after which she was placed on levaquin  and cefadroxil  but she stopped it herself because she felt it was too strong for her and not feeling well and her rt leg started to hurt  She was Dr.Fitzgerald on 08/04/24 and he placed her on Doxy 100mg  BID Pt c/o burning pain at the rt knee surgical site and also the calf and the rt foot- none on the left foot or left leg    Past Medical History:  Diagnosis Date   GERD (gastroesophageal reflux disease)    Headache    History of bronchitis    Hypertension    Morbid obesity (HCC)    Pre-diabetes    Primary osteoarthritis of right knee     Past Surgical History:  Procedure Laterality Date   burr holes to relieve the pressure     due to head injury   at age of 1 years   REVERSE SHOULDER ARTHROPLASTY Right 04/04/2021   Procedure: REVERSE SHOULDER ARTHROPLASTY;  Surgeon: Edie Norleen PARAS, MD;  Location: [facility];  Service: Orthopedics;  Laterality: Right;   SECONDARY CLOSURE OF WOUND Right 06/03/2024   Procedure: SECONDARY CLOSURE OF  WOUND, Polyethylene Exchange;  Surgeon: Edie Norleen PARAS, MD;  Location: [facility];  Service: Orthopedics;  Laterality: Right;   SHOULDER SURGERY Left    rotator cuff repair   TOTAL KNEE ARTHROPLASTY Right 04/21/2024   Procedure: ARTHROPLASTY, KNEE, TOTAL;  Surgeon: Edie Norleen PARAS, MD;  Location: [facility];  Service: Orthopedics;  Laterality: Right;    Social History   Socioeconomic History   Marital status: Legally Separated    Spouse name: Not on file   Number of children: Not on file   Years of education: Not on file   Highest education level: Not on file  Occupational History   Not on file  Tobacco Use   Smoking status: Every Day    Current packs/day: 0.25    Average packs/day: 0.3 packs/day for 0.6 years (0.1 ttl pk-yrs)    Types: Cigarettes    Start date: 01/13/2024   Smokeless tobacco: Never  Vaping Use   Vaping status: Never Used  Substance and Sexual Activity   Alcohol use: Yes    Alcohol/week: 1.0 standard drink of alcohol    Types: 1 Glasses of wine per week    Comment: occa   Drug use: No   Sexual activity: Not on file  Other Topics Concern   Not on file  Social History Narrative   Lives alone. But has two daughters and two sisters support persons.    Social Drivers of Health   Tobacco Use: High Risk (08/18/2024)   Patient History    Smoking Tobacco Use: Every Day    Smokeless Tobacco Use: Never    Passive Exposure: Not on file  Financial Resource Strain: Low Risk  (06/22/2024)   Received [facility]   Overall Financial Resource Strain (CARDIA)    Difficulty of Paying Living Expenses: Not hard at all  Food Insecurity: No Food Insecurity (06/22/2024)   Received from [facility]   Epic    Within the past 12 months, you worried that your food would run out before you got the money to buy more.: Never true    Within the past 12 months, the food you bought just didn't last and you didn't have money to get more.: Never true   Transportation Needs: No Transportation Needs (06/22/2024)   Received from [facility] - Transportation    In the past 12 months, has lack of transportation kept you from medical appointments or from getting medications?: No    Lack of Transportation (Non-Medical): No  Physical Activity: Not on file  Stress: Not on file  Social Connections: Unknown (06/03/2024)   Social Connection and Isolation Panel    Frequency of Communication with Friends and Family: More than three times a week    Frequency of Social Gatherings with Friends and Family: Twice a week    Attends Religious Services: More than 4 times per year    Active Member of [employer] or Organizations: No    Attends Banker Meetings: 1 to 4 times per year    Marital Status: Not on file  Intimate Partner Violence: Patient Declined (06/03/2024)   Epic    Fear of Current or Ex-Partner: Patient declined    Emotionally Abused: Patient declined    Physically Abused: Patient declined    Sexually Abused: Patient declined  Depression (PHQ2-9): Not on file  Alcohol Screen: Not on file  Housing: Low Risk  (06/22/2024)   Received from [facility]   Epic    In the last 12 months, was there a time when you were not able to pay the mortgage or rent on time?: No    In the past 12 months, how many times have you moved where you were living?: 0    At any time in the past 12 months, were you homeless or living in a shelter (including now)?: No  Utilities: Not At Risk (06/22/2024)   Received from [facility]   Epic    In the past 12 months has the electric, gas, oil, or water company threatened to shut off services in your home?: No  Health Literacy: Not on file    Family History  Problem Relation Age of Onset   Cancer Mother    CAD Father    No Known Allergies I? Current Outpatient Medications  Medication Sig Dispense Refill   albuterol  (VENTOLIN  HFA) 108 (90 Base)  MCG/ACT inhaler Inhale 2 puffs into the lungs every 4 (four) hours as needed for wheezing or shortness of breath. 18 g 0   doxycycline  (VIBRAMYCIN ) 100 MG capsule Take by mouth 2 (two) times daily.     ketorolac  (ACULAR ) 0.5 % ophthalmic solution Place 1 drop into the left eye every 6 (six) hours. 5 mL 0  losartan -hydrochlorothiazide  (HYZAAR) 100-25 MG tablet Take 1 tablet by mouth daily.     meloxicam (MOBIC) 15 MG tablet Take 15 mg by mouth daily.     traMADol  (ULTRAM ) 50 MG tablet Take 1 tablet (50 mg total) by mouth daily. 30 tablet 0   No current facility-administered medications for this visit.     Abtx:  Anti-infectives (From admission, onward)    None       REVIEW OF SYSTEMS:  Const: negative fever, negative chills, negative weight loss Eyes: negative diplopia or visual changes, negative eye pain ENT: negative coryza, negative sore throat Resp: negative cough, hemoptysis, dyspnea Cards: negative for chest pain, palpitations, lower extremity edema GU: negative for frequency, dysuria and hematuria GI: Negative for abdominal pain, diarrhea, bleeding, constipation Skin: negative for rash and pruritus Heme: negative for easy bruising and gum/nose bleeding FD:almwpwh sensation rt knee and rt calf and rt foot Neurolo:negative for headaches, dizziness, vertigo, memory problems  Psych: negative for feelings of anxiety, depression  Endocrine: negative for thyroid, diabetes Allergy/Immunology- negative for any medication or food allergies  Objective:  VITALS:  BP (!) 165/96   Pulse 88   Temp (!) 97.3 F (36.3 C) (Temporal)   SpO2 98%   PHYSICAL EXAM:  General: Alert, cooperative, no distress, appears stated age.  Head: Normocephalic, without obvious abnormality, atraumatic. Eyes: Conjunctivae clear, anicteric sclerae. Pupils are equal ENT Nares normal. No drainage or sinus tenderness. Lips, mucosa, and tongue normal. No Thrush Neck: Supple, symmetrical, no adenopathy,  thyroid: non tender no carotid bruit and no JVD. Back: No CVA tenderness. Lungs: Clear to auscultation bilaterally. No Wheezing or Rhonchi. No rales. Heart: Regular rate and rhythm, no murmur, rub or gallop. Abdomen: Soft, non-tender,not distended. Bowel sounds normal. No masses Extremities: rt leg immobilizer Skin: No rashes or lesions. Or bruising Lymph: Cervical, supraclavicular normal. Neurologic: Grossly non-focal Pertinent Labs 08/04/24 Sed rate 26 Crp N   ? Impression/Recommendation ?Right knee prosthetic joint infection culture negative- doing well  Patient underwent I&D and poly exchange on 06/03/24.  Cultures negative She took 4 weeks of Iv dapto and Iv ceftriaxone  Followed by po levaquin  and po cefadroxil -The plan was to treat for total of 12 weeks . she was not able to tolerate the antibiotics- saw Dr.fitzgerald on 08/04/24. They were stopped and she was placed on Doxy -  The knee swelling has resolved- mobility improved- Sed rate and CRP N She now has parasthesia rt knee  Neuropathy right  knee and foot- pt will discuss with her PCP/ortho for possible gabapentin    Hypertension on losartan  and hydrochlorothiazide    Anemia   Discussed the management with the patient  in detail She will follow with her PCP/ortho regarding the paresthesia Follow PRN  ________________________________________________

## 2024-08-18 NOTE — Patient Instructions (Signed)
 You are here for rt knee PJI culture neg- You are pn Doxy and will complete 09/03/24- You have paresthesia on the rt knee and rt calf and foot- please discuss with your orthopedician and PCP regarding rx. Will see you if needed.

## 2024-09-11 ENCOUNTER — Telehealth: Payer: Self-pay

## 2024-09-11 NOTE — Telephone Encounter (Signed)
 Received call from patient requesting copy of recent blood work for wellness program with her employer. She will access her results via MyChart and call back if she needs printed copy.   Maylyn Narvaiz, BSN, RN
